# Patient Record
Sex: Female | Born: 1965 | Race: White | Hispanic: No | Marital: Married | Smoking: Former smoker
Health system: Southern US, Community
[De-identification: ages and names within clinical notes are randomized; demographics above are authoritative.]

## PROBLEM LIST (undated history)

## (undated) DIAGNOSIS — N200 Calculus of kidney: Secondary | ICD-10-CM

## (undated) HISTORY — PX: BACK SURGERY: SHX140

## (undated) HISTORY — PX: OTHER SURGICAL HISTORY: SHX169

## (undated) HISTORY — DX: Calculus of kidney: N20.0

---

## 2011-05-13 ENCOUNTER — Emergency Department (HOSPITAL_COMMUNITY)

## 2011-05-13 ENCOUNTER — Emergency Department (HOSPITAL_COMMUNITY)
Admission: EM | Admit: 2011-05-13 | Discharge: 2011-05-13 | Disposition: A | Discharge status: Home / Self Care | Attending: Emergency Medicine | Admitting: Emergency Medicine

## 2011-05-13 DIAGNOSIS — S91109A Unspecified open wound of unspecified toe(s) without damage to nail, initial encounter: Secondary | ICD-10-CM | POA: Insufficient documentation

## 2011-05-13 DIAGNOSIS — IMO0002 Reserved for concepts with insufficient information to code with codable children: Secondary | ICD-10-CM | POA: Insufficient documentation

## 2011-05-13 DIAGNOSIS — R42 Dizziness and giddiness: Secondary | ICD-10-CM | POA: Insufficient documentation

## 2011-05-13 DIAGNOSIS — M79609 Pain in unspecified limb: Secondary | ICD-10-CM | POA: Insufficient documentation

## 2011-05-13 DIAGNOSIS — F411 Generalized anxiety disorder: Secondary | ICD-10-CM | POA: Insufficient documentation

## 2011-05-13 DIAGNOSIS — Y92009 Unspecified place in unspecified non-institutional (private) residence as the place of occurrence of the external cause: Secondary | ICD-10-CM | POA: Insufficient documentation

## 2011-05-13 DIAGNOSIS — S8990XA Unspecified injury of unspecified lower leg, initial encounter: Secondary | ICD-10-CM | POA: Insufficient documentation

## 2011-05-13 DIAGNOSIS — S99919A Unspecified injury of unspecified ankle, initial encounter: Secondary | ICD-10-CM | POA: Insufficient documentation

## 2013-05-27 DIAGNOSIS — F900 Attention-deficit hyperactivity disorder, predominantly inattentive type: Secondary | ICD-10-CM | POA: Insufficient documentation

## 2013-11-11 DIAGNOSIS — F339 Major depressive disorder, recurrent, unspecified: Secondary | ICD-10-CM | POA: Insufficient documentation

## 2014-07-31 DIAGNOSIS — M5412 Radiculopathy, cervical region: Secondary | ICD-10-CM | POA: Insufficient documentation

## 2014-07-31 DIAGNOSIS — M47812 Spondylosis without myelopathy or radiculopathy, cervical region: Secondary | ICD-10-CM | POA: Insufficient documentation

## 2014-07-31 DIAGNOSIS — M542 Cervicalgia: Secondary | ICD-10-CM | POA: Insufficient documentation

## 2017-09-28 DIAGNOSIS — F330 Major depressive disorder, recurrent, mild: Secondary | ICD-10-CM | POA: Insufficient documentation

## 2017-11-01 ENCOUNTER — Other Ambulatory Visit (HOSPITAL_COMMUNITY): Payer: Self-pay | Admitting: Family Medicine

## 2017-11-01 ENCOUNTER — Ambulatory Visit (HOSPITAL_COMMUNITY)
Admission: RE | Admit: 2017-11-01 | Discharge: 2017-11-01 | Disposition: A | Discharge status: Home / Self Care | Source: Ambulatory Visit | Attending: Family Medicine | Admitting: Family Medicine

## 2017-11-01 DIAGNOSIS — M25511 Pain in right shoulder: Secondary | ICD-10-CM | POA: Diagnosis present

## 2017-11-01 DIAGNOSIS — M4802 Spinal stenosis, cervical region: Secondary | ICD-10-CM | POA: Diagnosis not present

## 2017-11-01 DIAGNOSIS — M542 Cervicalgia: Secondary | ICD-10-CM | POA: Diagnosis present

## 2017-11-01 DIAGNOSIS — M5030 Other cervical disc degeneration, unspecified cervical region: Secondary | ICD-10-CM | POA: Diagnosis not present

## 2018-01-09 ENCOUNTER — Emergency Department
Admission: EM | Admit: 2018-01-09 | Discharge: 2018-01-09 | Disposition: A | Discharge status: Home / Self Care | Attending: Emergency Medicine | Admitting: Emergency Medicine

## 2018-01-09 ENCOUNTER — Encounter: Payer: Self-pay | Admitting: Emergency Medicine

## 2018-01-09 ENCOUNTER — Emergency Department

## 2018-01-09 ENCOUNTER — Other Ambulatory Visit: Payer: Self-pay

## 2018-01-09 DIAGNOSIS — Y929 Unspecified place or not applicable: Secondary | ICD-10-CM | POA: Diagnosis not present

## 2018-01-09 DIAGNOSIS — S93401A Sprain of unspecified ligament of right ankle, initial encounter: Secondary | ICD-10-CM

## 2018-01-09 DIAGNOSIS — Z87891 Personal history of nicotine dependence: Secondary | ICD-10-CM | POA: Diagnosis not present

## 2018-01-09 DIAGNOSIS — W19XXXA Unspecified fall, initial encounter: Secondary | ICD-10-CM | POA: Diagnosis not present

## 2018-01-09 DIAGNOSIS — Y9364 Activity, baseball: Secondary | ICD-10-CM | POA: Diagnosis not present

## 2018-01-09 DIAGNOSIS — S99911A Unspecified injury of right ankle, initial encounter: Secondary | ICD-10-CM | POA: Diagnosis present

## 2018-01-09 DIAGNOSIS — Y999 Unspecified external cause status: Secondary | ICD-10-CM | POA: Insufficient documentation

## 2018-01-09 NOTE — ED Triage Notes (Signed)
Pt to triage via w/c with no distress noted; st injured right ankle while playing softball 2 nights ago; ankle is currently wrapped

## 2018-01-09 NOTE — Discharge Instructions (Addendum)
Your x-ray reveals some bone spurs but no evidence of fracture or dislocation. Wear the ankle brace as needed for support. Apply ice to reduce pain and swelling. Take OTC ibuprofen or naproxen for inflammation. Follow-up with podiatry for ongoing symptoms.

## 2018-01-09 NOTE — ED Notes (Signed)
Pt c/o right ankle pain d/t fall 2 days ago while playing softball. Pt has edema and bruising to her right ankle. Pt reports being concerned because it has not healed and is still causing her pain.

## 2018-01-09 NOTE — ED Provider Notes (Signed)
[facility] Emergency Department Provider Note ____________________________________________  Time seen: 2218  I have reviewed the triage vital signs and the nursing notes.  HISTORY  Chief Complaint  Ankle Pain  HPI Anne Haynes is a 52 y.o. female presents to the ED for evaluation of pain to the right ankle.  Patient describes injury 2 days prior to playing softball.  She describes a fall and noted edema and bruising to the right ankle and the lowe leg. She has been walking a lot today, without any brace or support.  She reports pain and disability to the ankle but denies any other injury at this time.  History reviewed. No pertinent past medical history.  There are no active problems to display for this patient.   Past Surgical History:  Procedure Laterality Date  . BACK SURGERY      Prior to Admission medications   Not on File    Allergies Patient has no known allergies.  No family history on file.  Social History Social History   Tobacco Use  . Smoking status: Former Games developer  . Smokeless tobacco: Former Engineer, water Use Topics  . Alcohol use: Not on file  . Drug use: Not on file    Review of Systems  Constitutional: Negative for fever. Cardiovascular: Negative for chest pain. Respiratory: Negative for shortness of breath. Musculoskeletal: Negative for back pain.  Right ankle pain as above. Skin: Negative for rash. Neurological: Negative for headaches, focal weakness or numbness. ____________________________________________  PHYSICAL EXAM:  VITAL SIGNS: ED Triage Vitals  Enc Vitals Group     BP 01/09/18 2120 (!) 153/91     Pulse Rate 01/09/18 2120 87     Resp 01/09/18 2120 18     Temp 01/09/18 2120 97.9 F (36.6 C)     Temp Source 01/09/18 2120 Oral     SpO2 01/09/18 2120 99 %     Weight 01/09/18 2121 170 lb (77.1 kg)     Height 01/09/18 2121 5\' 8"  (1.727 m)     Head Circumference --      Peak Flow --      Pain Score  01/09/18 2121 4     Pain Loc --      Pain Edu? --      Excl. in [place]? --     Constitutional: Alert and oriented. Well appearing and in no distress. Head: Normocephalic and atraumatic. Cardiovascular: Normal rate, regular rhythm. Normal distal pulses. Respiratory: Normal respiratory effort.  Musculoskeletal: Ankle with obvious soft tissue swelling laterally as well as some lateral foot ecchymosis appreciated.  Patient with normal ankle range of motion on exam.  No calf or Achilles tenderness is appreciated. There is also no anterior/posterior drawer sign.  Nontender with normal range of motion in all extremities.  Neurologic:  Normal gait without ataxia. Normal speech and language. No gross focal neurologic deficits are appreciated. Skin:  Skin is warm, dry and intact. No rash noted. ____________________________________________   RADIOLOGY  Right Ankle IMPRESSION: Lateral soft tissue swelling. No acute fracture or dislocation is identified. 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  I, Ellanore Vanhook, Charlesetta Ivory, personally viewed and evaluated these images (plain radiographs) as part of my medical decision making, as well as reviewing the written report by the radiologist. ____________________________________________  PROCEDURES  Procedures Velcro ankle stirrup splint ____________________________________________  INITIAL IMPRESSION / ASSESSMENT AND PLAN / ED COURSE  Patient with ED evaluation of ankle pain and swelling following mechanical fall.  Patient's exam is consistent with a grade 2 right ankle sprain.  Her x-rays negative and reassuring at this time for any acute fracture or dislocation she does have some mild to moderate bone spurring and ossicles noted.  She is discharged with instructions to dose over-the-counter ibuprofen.  She is referred to podiatry for ongoing management as necessary. ____________________________________________  FINAL CLINICAL  IMPRESSION(S) / ED DIAGNOSES  Final diagnoses:  Sprain of right ankle, unspecified ligament, initial encounter      Lissa HoardMenshew, Mirka Barbone V Bacon, PA-C 01/09/18 2252    Don PerkingVeronese, [place], MD 01/10/18 [id number]

## 2019-06-30 ENCOUNTER — Emergency Department

## 2019-06-30 ENCOUNTER — Emergency Department
Admission: EM | Admit: 2019-06-30 | Discharge: 2019-06-30 | Disposition: A | Discharge status: Home / Self Care | Attending: Emergency Medicine | Admitting: Emergency Medicine

## 2019-06-30 ENCOUNTER — Other Ambulatory Visit: Payer: Self-pay

## 2019-06-30 DIAGNOSIS — Z87891 Personal history of nicotine dependence: Secondary | ICD-10-CM | POA: Diagnosis not present

## 2019-06-30 DIAGNOSIS — N130 Hydronephrosis with ureteropelvic junction obstruction: Secondary | ICD-10-CM | POA: Insufficient documentation

## 2019-06-30 DIAGNOSIS — R1032 Left lower quadrant pain: Secondary | ICD-10-CM | POA: Diagnosis present

## 2019-06-30 DIAGNOSIS — N200 Calculus of kidney: Secondary | ICD-10-CM

## 2019-06-30 LAB — URINALYSIS, COMPLETE (UACMP) WITH MICROSCOPIC
Bacteria, UA: NONE SEEN
Bilirubin Urine: NEGATIVE
Glucose, UA: NEGATIVE mg/dL
Ketones, ur: NEGATIVE mg/dL
Nitrite: NEGATIVE
Protein, ur: NEGATIVE mg/dL
Specific Gravity, Urine: >1.046 — ABNORMAL HIGH (ref 1.005–1.030)
pH: 7 (ref 5.0–8.0)

## 2019-06-30 LAB — COMPREHENSIVE METABOLIC PANEL WITH GFR
ALT: 19 U/L (ref 0–44)
AST: 18 U/L (ref 15–41)
Albumin: 4.5 g/dL (ref 3.5–5.0)
Alkaline Phosphatase: 78 U/L (ref 38–126)
Anion gap: 4 — ABNORMAL LOW (ref 5–15)
BUN: 16 mg/dL (ref 6–20)
CO2: 28 mmol/L (ref 22–32)
Calcium: 11.8 mg/dL — ABNORMAL HIGH (ref 8.9–10.3)
Chloride: 106 mmol/L (ref 98–111)
Creatinine, Ser: 0.73 mg/dL (ref 0.44–1.00)
GFR calc Af Amer: >60 mL/min
GFR calc non Af Amer: >60 mL/min
Glucose, Bld: 107 mg/dL — ABNORMAL HIGH (ref 70–99)
Potassium: 3.7 mmol/L (ref 3.5–5.1)
Sodium: 138 mmol/L (ref 135–145)
Total Bilirubin: 0.8 mg/dL (ref 0.3–1.2)
Total Protein: 7.1 g/dL (ref 6.5–8.1)

## 2019-06-30 LAB — CBC
HCT: 46.1 % — ABNORMAL HIGH (ref 36.0–46.0)
Hemoglobin: 15.5 g/dL — ABNORMAL HIGH (ref 12.0–15.0)
MCH: 31.1 pg (ref 26.0–34.0)
MCHC: 33.6 g/dL (ref 30.0–36.0)
MCV: 92.6 fL (ref 80.0–100.0)
Platelets: 337 K/uL (ref 150–400)
RBC: 4.98 MIL/uL (ref 3.87–5.11)
RDW: 11.6 % (ref 11.5–15.5)
WBC: 10.6 K/uL — ABNORMAL HIGH (ref 4.0–10.5)
nRBC: 0 % (ref 0.0–0.2)

## 2019-06-30 LAB — LIPASE, BLOOD: Lipase: 22 U/L (ref 11–51)

## 2019-06-30 MED ORDER — IBUPROFEN 600 MG PO TABS: 600.0000 mg | ORAL_TABLET | Freq: Four times a day (QID) | ORAL | 0 refills | Status: DC | PRN

## 2019-06-30 MED ORDER — OXYCODONE-ACETAMINOPHEN 5-325 MG PO TABS: 1.0000 | ORAL_TABLET | Freq: Four times a day (QID) | ORAL | 0 refills | Status: DC | PRN

## 2019-06-30 MED ORDER — SODIUM CHLORIDE 0.9 % IV BOLUS
1000.0000 mL | Freq: Once | INTRAVENOUS | Status: AC
  Administered 2019-06-30: 1000 mL via INTRAVENOUS

## 2019-06-30 MED ORDER — ONDANSETRON HCL 4 MG/2ML IJ SOLN
4.0000 mg | Freq: Once | INTRAMUSCULAR | Status: AC
Start: 2019-06-30 — End: 2019-06-30
  Administered 2019-06-30: 4 mg via INTRAVENOUS
  Filled 2019-06-30: qty 2

## 2019-06-30 MED ORDER — HYDROCODONE-ACETAMINOPHEN 5-325 MG PO TABS: 2.0000 | ORAL_TABLET | Freq: Once | ORAL | Status: DC

## 2019-06-30 MED ORDER — ONDANSETRON 4 MG PO TBDP: 4.0000 mg | ORAL_TABLET | Freq: Three times a day (TID) | ORAL | 0 refills | Status: DC | PRN

## 2019-06-30 MED ORDER — HYDROMORPHONE HCL 1 MG/ML IJ SOLN
1.0000 mg | Freq: Once | INTRAMUSCULAR | Status: AC
  Administered 2019-06-30: 10:00:00 1 mg via INTRAVENOUS
  Filled 2019-06-30: qty 1

## 2019-06-30 MED ORDER — IOHEXOL 300 MG/ML  SOLN
100.0000 mL | Freq: Once | INTRAMUSCULAR | Status: AC | PRN
  Administered 2019-06-30: 100 mL via INTRAVENOUS

## 2019-06-30 MED ORDER — ONDANSETRON HCL 4 MG/2ML IJ SOLN
4.0000 mg | Freq: Once | INTRAMUSCULAR | Status: AC | PRN
  Administered 2019-06-30: 4 mg via INTRAVENOUS
  Filled 2019-06-30: qty 2

## 2019-06-30 MED ORDER — SODIUM CHLORIDE 0.9 % IV BOLUS
1000.0000 mL | Freq: Once | INTRAVENOUS | Status: AC
  Administered 2019-06-30: 10:00:00 1000 mL via INTRAVENOUS

## 2019-06-30 MED ORDER — KETOROLAC TROMETHAMINE 30 MG/ML IJ SOLN
15.0000 mg | Freq: Once | INTRAMUSCULAR | Status: AC
  Administered 2019-06-30: 11:00:00 15 mg via INTRAVENOUS
  Filled 2019-06-30: qty 1

## 2019-06-30 NOTE — ED Notes (Signed)
ED Provider Issacs at bedside. 

## 2019-06-30 NOTE — Discharge Instructions (Addendum)
As we discussed, drink plenty of fluid.  Avoid Tums or anything high in calcium.  I recommend having a repeat level checked in the next week, as high calcium could be causing your stones and could also be indicative of underlying more serious conditions.  Take the ibuprofen and stronger pain medications as needed.  I would expect your pain to improve over the next several days.  If it does not, call urology for a close follow-up appointment.

## 2019-06-30 NOTE — ED Notes (Signed)
Pt able to tolerate drink and saltines. Pt denies N/V at this time.

## 2019-06-30 NOTE — ED Notes (Signed)
Patient denies pain and is resting comfortably.  

## 2019-06-30 NOTE — ED Notes (Signed)
Pt ambulatory to toilet with a steady gait before DC.

## 2019-06-30 NOTE — ED Provider Notes (Signed)
[facility] Emergency Department Provider Note  ____________________________________________   First MD Initiated Contact with Patient 06/30/19 0901     (approximate)  I have reviewed the triage vital signs and the nursing notes.   HISTORY  Chief Complaint Abdominal Pain    HPI Anne Haynes is a 53 y.o. female here with left flank pain.  The patient states that she awoke this morning with severe, aching, gnawing, left flank pain.  This radiates down towards her left lower quadrant.  She had associated nausea and vomiting.  The pain seemed to come on and nowhere and was immediately severe.  It has since come and gone, but is persistent.  She denies any fevers.  She is not had any urinary symptoms, including no dysuria or hematuria.  No vaginal bleeding.  Denies any history of diverticulitis or colitis.  No other complaints.  She does report some intermittent abdominal cramping for several months, but this is not acutely worsened.        History reviewed. No pertinent past medical history.  There are no active problems to display for this patient.   Past Surgical History:  Procedure Laterality Date  . BACK SURGERY      Prior to Admission medications   Medication Sig Start Date End Date Taking? Authorizing Provider  ibuprofen (ADVIL) 600 MG tablet Take 1 tablet (600 mg total) by mouth every 6 (six) hours as needed for moderate pain. 06/30/19   Shaune PollackIsaacs, Lucely Leard, MD  ondansetron (ZOFRAN ODT) 4 MG disintegrating tablet Take 1 tablet (4 mg total) by mouth every 8 (eight) hours as needed for nausea or vomiting. 06/30/19   Shaune PollackIsaacs, Jarren Para, MD  oxyCODONE-acetaminophen (PERCOCET) 5-325 MG tablet Take 1-2 tablets by mouth every 6 (six) hours as needed for moderate pain or severe pain. 06/30/19 06/29/20  Shaune PollackIsaacs, Kahlen Morais, MD    Allergies Patient has no known allergies.  No family history on file.  Social History Social History   Tobacco Use  . Smoking  status: Former Games developermoker  . Smokeless tobacco: Former Engineer, waterUser  Substance Use Topics  . Alcohol use: Yes  . Drug use: Not Currently    Review of Systems  Review of Systems  Constitutional: Positive for fatigue. Negative for fever.  HENT: Negative for congestion and sore throat.   Eyes: Negative for visual disturbance.  Respiratory: Negative for cough and shortness of breath.   Cardiovascular: Negative for chest pain.  Gastrointestinal: Positive for abdominal pain and nausea. Negative for diarrhea and vomiting.  Genitourinary: Positive for flank pain.  Musculoskeletal: Negative for back pain and neck pain.  Skin: Negative for rash and wound.  Neurological: Negative for weakness.  All other systems reviewed and are negative.    ____________________________________________  PHYSICAL EXAM:      VITAL SIGNS: ED Triage Vitals  Enc Vitals Group     BP 06/30/19 0817 (!) 163/108     Pulse Rate 06/30/19 0817 (!) 101     Resp 06/30/19 0817 20     Temp 06/30/19 0817 97.8 F (36.6 C)     Temp Source 06/30/19 0817 Axillary     SpO2 06/30/19 0817 100 %     Weight --      Height --      Head Circumference --      Peak Flow --      Pain Score 06/30/19 0821 7     Pain Loc --      Pain Edu? --  Excl. in [place]? --      Physical Exam Vitals signs and nursing note reviewed.  Constitutional:      General: She is not in acute distress.    Appearance: She is well-developed.     Comments: Appears uncomfortable  HENT:     Head: Normocephalic and atraumatic.  Eyes:     Conjunctiva/sclera: Conjunctivae normal.  Neck:     Musculoskeletal: Neck supple.  Cardiovascular:     Rate and Rhythm: Normal rate and regular rhythm.     Heart sounds: Normal heart sounds.  Pulmonary:     Effort: Pulmonary effort is normal. No respiratory distress.     Breath sounds: No wheezing.  Abdominal:     General: Bowel sounds are normal. There is no distension.     Tenderness: There is abdominal tenderness in  the periumbilical area, left upper quadrant and left lower quadrant. There is no guarding or rebound.  Skin:    General: Skin is warm.     Capillary Refill: Capillary refill takes less than 2 seconds.     Findings: No rash.  Neurological:     Mental Status: She is alert and oriented to person, place, and time.     Motor: No abnormal muscle tone.       ____________________________________________   LABS (all labs ordered are listed, but only abnormal results are displayed)  Labs Reviewed  COMPREHENSIVE METABOLIC PANEL - Abnormal; Notable for the following components:      Result Value   Glucose, Bld 107 (*)    Calcium 11.8 (*)    Anion gap 4 (*)    All other components within normal limits  CBC - Abnormal; Notable for the following components:   WBC 10.6 (*)    Hemoglobin 15.5 (*)    HCT 46.1 (*)    All other components within normal limits  URINALYSIS, COMPLETE (UACMP) WITH MICROSCOPIC - Abnormal; Notable for the following components:   Color, Urine YELLOW (*)    APPearance HAZY (*)    Specific Gravity, Urine >1.046 (*)    Hgb urine dipstick MODERATE (*)    Leukocytes,Ua TRACE (*)    All other components within normal limits  LIPASE, BLOOD    ____________________________________________  EKG: None ________________________________________  RADIOLOGY All imaging, including plain films, CT scans, and ultrasounds, independently reviewed by me, and interpretations confirmed via formal radiology reads.  ED MD interpretation:   CT scan: Moderate left hydrodue to 8 mm calculus at the UPJ, mild left ureteral dilation due to 4 mm at the left UVJ  Official radiology report(s): Dg Elbow Complete Left  Result Date: 06/30/2019 CLINICAL DATA:  Left elbow pain EXAM: LEFT ELBOW - COMPLETE 3+ VIEW COMPARISON:  None. FINDINGS: There is no evidence of fracture, dislocation, or joint effusion. There is no evidence of arthropathy or other focal bone abnormality. Soft tissues are  unremarkable. IMPRESSION: Negative. Electronically Signed   By: Rolm Baptise M.D.   On: 06/30/2019 10:26   Ct Abdomen Pelvis W Contrast  Result Date: 06/30/2019 CLINICAL DATA:  Acute left lower quadrant abdominal pain. EXAM: CT ABDOMEN AND PELVIS WITH CONTRAST TECHNIQUE: Multidetector CT imaging of the abdomen and pelvis was performed using the standard protocol following bolus administration of intravenous contrast. CONTRAST:  137mL OMNIPAQUE IOHEXOL 300 MG/ML  SOLN COMPARISON:  None. FINDINGS: Lower chest: No acute abnormality. Hepatobiliary: No focal liver abnormality is seen. No gallstones, gallbladder wall thickening, or biliary dilatation. Pancreas: Unremarkable. No pancreatic ductal dilatation or surrounding  inflammatory changes. Spleen: Normal in size without focal abnormality. Kidneys: Adrenal glands are unremarkable. Right kidney and ureter are unremarkable. Moderate left hydronephrosis is noted secondary to 8 mm calculus at the left ureteropelvic junction. Mild left ureteral dilatation is noted secondary to 4 mm calculus at the left ureterovesical junction. Urinary bladder is otherwise unremarkable. Stomach/Bowel: Stomach is within normal limits. Appendix appears normal. No evidence of bowel wall thickening, distention, or inflammatory changes. Vascular/Lymphatic: No significant vascular findings are present. No enlarged abdominal or pelvic lymph nodes. Reproductive: Uterus and bilateral adnexa are unremarkable. Other: No abdominal wall hernia or abnormality. No abdominopelvic ascites. Musculoskeletal: No acute or significant osseous findings. IMPRESSION: Moderate left hydronephrosis is noted secondary to 8 mm calculus at the left ureteropelvic junction. Mild left ureteral dilatation is noted secondary to 4 mm calculus at the left ureterovesical junction. Electronically Signed   By: Lupita Raider M.D.   On: 06/30/2019 10:13     ____________________________________________  PROCEDURES   Procedure(s) performed (including Critical Care):  Procedures  ____________________________________________  INITIAL IMPRESSION / MDM / ASSESSMENT AND PLAN / ED COURSE  As part of my medical decision making, I reviewed the following data within the electronic MEDICAL RECORD NUMBER       *Anne Haynes was evaluated in Emergency Department on 06/30/2019 for the symptoms described in the history of present illness. She was evaluated in the context of the global COVID-19 pandemic, which necessitated consideration that the patient might be at risk for infection with the SARS-CoV-2 virus that causes COVID-19. Institutional protocols and algorithms that pertain to the evaluation of patients at risk for COVID-19 are in a state of rapid change based on information released by regulatory bodies including the CDC and federal and state organizations. These policies and algorithms were followed during the patient's care in the ED.  Some ED evaluations and interventions may be delayed as a result of limited staffing during the pandemic.*      Medical Decision Making: 53 year old female here with severe left flank pain.  Imaging is consistent with 4 mm stone at the left UVJ which I suspect is causing her symptoms, though she also incidentally has an 8 mm stone at the UPJ.  Following fluids and analgesia, patient has had complete resolution of her pain.  She has no significant fever, tachycardia, or pyuria to suggest infected stone.  Of note, she does have moderate hypercalcemia.  This is unclear etiology.  No history of thyroid or parathyroid issues.  She denies any excessive calcium in her diet.  No known history of malignancy.  I reiterated the importance of close PCP follow-up for work-up of her hypercalcemia, and she was given 2 L of fluid here.  ____________________________________________  FINAL CLINICAL IMPRESSION(S) / ED  DIAGNOSES  Final diagnoses:  Nephrolithiasis  Hypercalcemia     MEDICATIONS GIVEN DURING THIS VISIT:  Medications  HYDROcodone-acetaminophen (NORCO/VICODIN) 5-325 MG per tablet 2 tablet (0 tablets Oral Hold 06/30/19 1208)  ondansetron (ZOFRAN) injection 4 mg (4 mg Intravenous Given 06/30/19 0846)  sodium chloride 0.9 % bolus 1,000 mL (1,000 mLs Intravenous New Bag/Given 06/30/19 0945)  sodium chloride 0.9 % bolus 1,000 mL (1,000 mLs Intravenous New Bag/Given 06/30/19 0942)  HYDROmorphone (DILAUDID) injection 1 mg (1 mg Intravenous Given 06/30/19 0943)  ondansetron (ZOFRAN) injection 4 mg (4 mg Intravenous Given 06/30/19 0943)  iohexol (OMNIPAQUE) 300 MG/ML solution 100 mL (100 mLs Intravenous Contrast Given 06/30/19 0954)  ketorolac (TORADOL) 30 MG/ML injection 15 mg (15 mg Intravenous Given  06/30/19 1048)     ED Discharge Orders         Ordered    oxyCODONE-acetaminophen (PERCOCET) 5-325 MG tablet  Every 6 hours PRN     06/30/19 1228    ondansetron (ZOFRAN ODT) 4 MG disintegrating tablet  Every 8 hours PRN     06/30/19 1228    ibuprofen (ADVIL) 600 MG tablet  Every 6 hours PRN     06/30/19 1228           Note:  This document was prepared using Dragon voice recognition software and may include unintentional dictation errors.   Shaune Pollack, MD 06/30/19 1228

## 2019-06-30 NOTE — ED Notes (Signed)
Pt ambulatory to bathroom with a steady gait

## 2019-06-30 NOTE — ED Notes (Signed)
Pt provide with ginger ale and saltines per MD request.

## 2019-06-30 NOTE — ED Triage Notes (Signed)
Pt c/o LLQ pain with N/V that started about 3 hr PTA. Denies diarrhea

## 2019-07-01 ENCOUNTER — Other Ambulatory Visit: Payer: Self-pay

## 2019-07-01 ENCOUNTER — Observation Stay: Admitting: Anesthesiology

## 2019-07-01 ENCOUNTER — Observation Stay
Admission: EM | Admit: 2019-07-01 | Discharge: 2019-07-02 | Disposition: A | Discharge status: Home / Self Care | Attending: Internal Medicine | Admitting: Internal Medicine

## 2019-07-01 ENCOUNTER — Encounter
Admission: EM | Disposition: A | Discharge status: Home / Self Care | Payer: Self-pay | Source: Home / Self Care | Attending: Emergency Medicine

## 2019-07-01 ENCOUNTER — Encounter: Payer: Self-pay | Admitting: Emergency Medicine

## 2019-07-01 DIAGNOSIS — Z87891 Personal history of nicotine dependence: Secondary | ICD-10-CM | POA: Insufficient documentation

## 2019-07-01 DIAGNOSIS — Z23 Encounter for immunization: Secondary | ICD-10-CM | POA: Insufficient documentation

## 2019-07-01 DIAGNOSIS — I10 Essential (primary) hypertension: Secondary | ICD-10-CM | POA: Diagnosis not present

## 2019-07-01 DIAGNOSIS — M47812 Spondylosis without myelopathy or radiculopathy, cervical region: Secondary | ICD-10-CM | POA: Insufficient documentation

## 2019-07-01 DIAGNOSIS — Z7901 Long term (current) use of anticoagulants: Secondary | ICD-10-CM | POA: Insufficient documentation

## 2019-07-01 DIAGNOSIS — Z791 Long term (current) use of non-steroidal anti-inflammatories (NSAID): Secondary | ICD-10-CM | POA: Insufficient documentation

## 2019-07-01 DIAGNOSIS — Z20828 Contact with and (suspected) exposure to other viral communicable diseases: Secondary | ICD-10-CM | POA: Insufficient documentation

## 2019-07-01 DIAGNOSIS — F909 Attention-deficit hyperactivity disorder, unspecified type: Secondary | ICD-10-CM | POA: Diagnosis not present

## 2019-07-01 DIAGNOSIS — F329 Major depressive disorder, single episode, unspecified: Secondary | ICD-10-CM | POA: Insufficient documentation

## 2019-07-01 DIAGNOSIS — R32 Unspecified urinary incontinence: Secondary | ICD-10-CM | POA: Diagnosis not present

## 2019-07-01 DIAGNOSIS — N132 Hydronephrosis with renal and ureteral calculous obstruction: Secondary | ICD-10-CM | POA: Diagnosis not present

## 2019-07-01 DIAGNOSIS — D72829 Elevated white blood cell count, unspecified: Secondary | ICD-10-CM | POA: Diagnosis not present

## 2019-07-01 DIAGNOSIS — Z79899 Other long term (current) drug therapy: Secondary | ICD-10-CM | POA: Insufficient documentation

## 2019-07-01 DIAGNOSIS — N201 Calculus of ureter: Secondary | ICD-10-CM | POA: Diagnosis present

## 2019-07-01 DIAGNOSIS — N1330 Unspecified hydronephrosis: Secondary | ICD-10-CM | POA: Diagnosis present

## 2019-07-01 HISTORY — PX: CYSTOSCOPY WITH STENT PLACEMENT: SHX5790

## 2019-07-01 HISTORY — PX: URETEROSCOPY WITH HOLMIUM LASER LITHOTRIPSY: SHX6645

## 2019-07-01 HISTORY — PX: STONE EXTRACTION WITH BASKET: SHX5318

## 2019-07-01 LAB — CBC WITH DIFFERENTIAL/PLATELET
Abs Immature Granulocytes: 0.06 10*3/uL (ref 0.00–0.07)
Basophils Absolute: 0 10*3/uL (ref 0.0–0.1)
Basophils Relative: 0 %
Eosinophils Absolute: 0 10*3/uL (ref 0.0–0.5)
Eosinophils Relative: 0 %
HCT: 44.8 % (ref 36.0–46.0)
Hemoglobin: 15.1 g/dL — ABNORMAL HIGH (ref 12.0–15.0)
Immature Granulocytes: 0 %
Lymphocytes Relative: 10 %
Lymphs Abs: 1.7 10*3/uL (ref 0.7–4.0)
MCH: 31.4 pg (ref 26.0–34.0)
MCHC: 33.7 g/dL (ref 30.0–36.0)
MCV: 93.1 fL (ref 80.0–100.0)
Monocytes Absolute: 0.9 10*3/uL (ref 0.1–1.0)
Monocytes Relative: 5 %
Neutro Abs: 13.7 10*3/uL — ABNORMAL HIGH (ref 1.7–7.7)
Neutrophils Relative %: 85 %
Platelets: 322 10*3/uL (ref 150–400)
RBC: 4.81 MIL/uL (ref 3.87–5.11)
RDW: 11.7 % (ref 11.5–15.5)
WBC: 16.4 10*3/uL — ABNORMAL HIGH (ref 4.0–10.5)
nRBC: 0 % (ref 0.0–0.2)

## 2019-07-01 LAB — BASIC METABOLIC PANEL
Anion gap: 9 (ref 5–15)
BUN: 13 mg/dL (ref 6–20)
CO2: 23 mmol/L (ref 22–32)
Calcium: 11.8 mg/dL — ABNORMAL HIGH (ref 8.9–10.3)
Chloride: 109 mmol/L (ref 98–111)
Creatinine, Ser: 0.87 mg/dL (ref 0.44–1.00)
GFR calc Af Amer: >60 mL/min (ref >60)
GFR calc non Af Amer: >60 mL/min (ref >60)
Glucose, Bld: 121 mg/dL — ABNORMAL HIGH (ref 70–99)
Potassium: 4.1 mmol/L (ref 3.5–5.1)
Sodium: 141 mmol/L (ref 135–145)

## 2019-07-01 LAB — SARS CORONAVIRUS 2 BY RT PCR (HOSPITAL ORDER, PERFORMED IN ~~LOC~~ HOSPITAL LAB): SARS Coronavirus 2: NEGATIVE

## 2019-07-01 SURGERY — URETEROSCOPY, WITH LITHOTRIPSY USING HOLMIUM LASER
Anesthesia: General | Site: Ureter | Laterality: Left

## 2019-07-01 MED ORDER — SODIUM CHLORIDE FLUSH 0.9 % IV SOLN
INTRAVENOUS | Status: AC
  Administered 2019-07-01: 15:00:00
  Filled 2019-07-01: qty 10

## 2019-07-01 MED ORDER — HYDROMORPHONE HCL 1 MG/ML IJ SOLN
1.0000 mg | Freq: Once | INTRAMUSCULAR | Status: AC
  Administered 2019-07-01: 1 mg via INTRAVENOUS
  Filled 2019-07-01: qty 1

## 2019-07-01 MED ORDER — DEXTROAMPHETAMINE SULFATE ER 5 MG PO CP24: 30.0000 mg | ORAL_CAPSULE | Freq: Two times a day (BID) | ORAL | Status: DC

## 2019-07-01 MED ORDER — ACETAMINOPHEN 325 MG PO TABS
650.0000 mg | ORAL_TABLET | Freq: Four times a day (QID) | ORAL | Status: DC | PRN
  Administered 2019-07-01: 650 mg via ORAL
  Filled 2019-07-01: qty 2

## 2019-07-01 MED ORDER — ROCURONIUM BROMIDE 100 MG/10ML IV SOLN
INTRAVENOUS | Status: DC | PRN
  Administered 2019-07-01: 40 mg via INTRAVENOUS

## 2019-07-01 MED ORDER — KETOROLAC TROMETHAMINE 30 MG/ML IJ SOLN
15.0000 mg | Freq: Once | INTRAMUSCULAR | Status: AC
  Administered 2019-07-01: 15 mg via INTRAVENOUS
  Filled 2019-07-01: qty 1

## 2019-07-01 MED ORDER — DEXTROAMPHETAMINE SULFATE ER 5 MG PO CP24
30.0000 mg | ORAL_CAPSULE | Freq: Two times a day (BID) | ORAL | Status: DC
  Administered 2019-07-02 (×2): 30 mg via ORAL
  Filled 2019-07-01 (×2): qty 6

## 2019-07-01 MED ORDER — MAGNESIUM SULFATE 2 GM/50ML IV SOLN
2.0000 g | Freq: Once | INTRAVENOUS | Status: AC
  Administered 2019-07-02: 2 g via INTRAVENOUS
  Filled 2019-07-01: qty 50

## 2019-07-01 MED ORDER — HYDROMORPHONE HCL 1 MG/ML IJ SOLN
1.0000 mg | Freq: Once | INTRAMUSCULAR | Status: AC
  Administered 2019-07-01: 08:00:00 1 mg via INTRAVENOUS
  Filled 2019-07-01: qty 1

## 2019-07-01 MED ORDER — ONDANSETRON HCL 4 MG PO TABS: 4.0000 mg | ORAL_TABLET | Freq: Four times a day (QID) | ORAL | Status: DC | PRN

## 2019-07-01 MED ORDER — BISACODYL 5 MG PO TBEC: 5.0000 mg | DELAYED_RELEASE_TABLET | Freq: Every day | ORAL | Status: DC | PRN

## 2019-07-01 MED ORDER — ONDANSETRON HCL 4 MG/2ML IJ SOLN
4.0000 mg | Freq: Once | INTRAMUSCULAR | Status: AC
  Administered 2019-07-01: 4 mg via INTRAVENOUS
  Filled 2019-07-01: qty 2

## 2019-07-01 MED ORDER — OXYBUTYNIN CHLORIDE 5 MG PO TABS
ORAL_TABLET | ORAL | Status: AC
  Administered 2019-07-01: 17:00:00
  Filled 2019-07-01: qty 1

## 2019-07-01 MED ORDER — SUMATRIPTAN SUCCINATE 50 MG PO TABS
50.0000 mg | ORAL_TABLET | Freq: Once | ORAL | Status: AC
  Administered 2019-07-01: 50 mg via ORAL
  Filled 2019-07-01: qty 1

## 2019-07-01 MED ORDER — MIDAZOLAM HCL 2 MG/2ML IJ SOLN
INTRAMUSCULAR | Status: DC | PRN
  Administered 2019-07-01: 2 mg via INTRAVENOUS

## 2019-07-01 MED ORDER — LIDOCAINE HCL (CARDIAC) PF 100 MG/5ML IV SOSY
PREFILLED_SYRINGE | INTRAVENOUS | Status: DC | PRN
  Administered 2019-07-01: 60 mg via INTRAVENOUS

## 2019-07-01 MED ORDER — OXYCODONE HCL 5 MG/5ML PO SOLN: 5.0000 mg | Freq: Once | ORAL | Status: DC | PRN

## 2019-07-01 MED ORDER — SODIUM CHLORIDE 0.9 % IV BOLUS
1000.0000 mL | Freq: Once | INTRAVENOUS | Status: AC
Start: 2019-07-01 — End: 2019-07-01
  Administered 2019-07-01: 1000 mL via INTRAVENOUS

## 2019-07-01 MED ORDER — PHENYLEPHRINE HCL (PRESSORS) 10 MG/ML IV SOLN
INTRAVENOUS | Status: DC | PRN
  Administered 2019-07-01 (×2): 100 ug via INTRAVENOUS

## 2019-07-01 MED ORDER — VALPROATE SODIUM 500 MG/5ML IV SOLN
500.0000 mg | Freq: Once | INTRAVENOUS | Status: AC
  Administered 2019-07-01: 500 mg via INTRAVENOUS
  Filled 2019-07-01: qty 5

## 2019-07-01 MED ORDER — FENTANYL CITRATE (PF) 100 MCG/2ML IJ SOLN
INTRAMUSCULAR | Status: DC | PRN
  Administered 2019-07-01: 50 ug via INTRAVENOUS
  Administered 2019-07-01: 25 ug via INTRAVENOUS

## 2019-07-01 MED ORDER — HYDROMORPHONE HCL 1 MG/ML IJ SOLN: 1.0000 mg | INTRAMUSCULAR | Status: DC | PRN

## 2019-07-01 MED ORDER — SODIUM CHLORIDE 0.9 % IV SOLN
1.0000 g | Freq: Once | INTRAVENOUS | Status: AC
  Administered 2019-07-01: 1 g via INTRAVENOUS
  Filled 2019-07-01: qty 10

## 2019-07-01 MED ORDER — DEXAMETHASONE SODIUM PHOSPHATE 10 MG/ML IJ SOLN
INTRAMUSCULAR | Status: DC | PRN
  Administered 2019-07-01: 4 mg via INTRAVENOUS

## 2019-07-01 MED ORDER — INFLUENZA VAC SPLIT QUAD 0.5 ML IM SUSY
0.5000 mL | PREFILLED_SYRINGE | INTRAMUSCULAR | Status: AC
  Administered 2019-07-02: 0.5 mL via INTRAMUSCULAR
  Filled 2019-07-01: qty 0.5

## 2019-07-01 MED ORDER — HYDROCODONE-ACETAMINOPHEN 5-325 MG PO TABS: 1.0000 | ORAL_TABLET | ORAL | Status: DC | PRN

## 2019-07-01 MED ORDER — IOHEXOL 180 MG/ML  SOLN
INTRAMUSCULAR | Status: DC | PRN
  Administered 2019-07-01: 16:00:00 20 mL

## 2019-07-01 MED ORDER — ONDANSETRON HCL 4 MG/2ML IJ SOLN: 4.0000 mg | Freq: Four times a day (QID) | INTRAMUSCULAR | Status: DC | PRN

## 2019-07-01 MED ORDER — ALBUTEROL SULFATE (2.5 MG/3ML) 0.083% IN NEBU: 2.5000 mg | INHALATION_SOLUTION | RESPIRATORY_TRACT | Status: DC | PRN

## 2019-07-01 MED ORDER — SENNOSIDES-DOCUSATE SODIUM 8.6-50 MG PO TABS: 1.0000 | ORAL_TABLET | Freq: Every evening | ORAL | Status: DC | PRN

## 2019-07-01 MED ORDER — SODIUM CHLORIDE 0.9 % IV SOLN
INTRAVENOUS | Status: DC | PRN
  Administered 2019-07-01: 15:00:00 via INTRAVENOUS

## 2019-07-01 MED ORDER — OXYMETAZOLINE HCL 0.05 % NA SOLN
1.0000 | Freq: Once | NASAL | Status: AC
  Administered 2019-07-01: 1 via NASAL
  Filled 2019-07-01: qty 30

## 2019-07-01 MED ORDER — PROPOFOL 10 MG/ML IV BOLUS
INTRAVENOUS | Status: DC | PRN
  Administered 2019-07-01: 100 mg via INTRAVENOUS
  Administered 2019-07-01: 20 mg via INTRAVENOUS

## 2019-07-01 MED ORDER — ALPRAZOLAM 0.5 MG PO TABS: 0.5000 mg | ORAL_TABLET | Freq: Three times a day (TID) | ORAL | Status: DC | PRN

## 2019-07-01 MED ORDER — SUGAMMADEX SODIUM 200 MG/2ML IV SOLN
INTRAVENOUS | Status: DC | PRN
  Administered 2019-07-01: 200 mg via INTRAVENOUS

## 2019-07-01 MED ORDER — SODIUM CHLORIDE 0.9 % IV SOLN
INTRAVENOUS | Status: DC
  Administered 2019-07-01 – 2019-07-02 (×2): via INTRAVENOUS

## 2019-07-01 MED ORDER — FENTANYL CITRATE (PF) 100 MCG/2ML IJ SOLN: 25.0000 ug | INTRAMUSCULAR | Status: DC | PRN

## 2019-07-01 MED ORDER — OXYBUTYNIN CHLORIDE 5 MG PO TABS
5.0000 mg | ORAL_TABLET | Freq: Once | ORAL | Status: AC
  Administered 2019-07-01: 5 mg via ORAL

## 2019-07-01 MED ORDER — TAMSULOSIN HCL 0.4 MG PO CAPS
0.4000 mg | ORAL_CAPSULE | Freq: Every day | ORAL | Status: DC
  Administered 2019-07-01 – 2019-07-02 (×2): 0.4 mg via ORAL
  Filled 2019-07-01 (×2): qty 1

## 2019-07-01 MED ORDER — ACETAMINOPHEN 650 MG RE SUPP: 650.0000 mg | Freq: Four times a day (QID) | RECTAL | Status: DC | PRN

## 2019-07-01 MED ORDER — ONDANSETRON HCL 4 MG/2ML IJ SOLN
INTRAMUSCULAR | Status: DC | PRN
  Administered 2019-07-01: 4 mg via INTRAVENOUS

## 2019-07-01 MED ORDER — OXYCODONE HCL 5 MG PO TABS: 5.0000 mg | ORAL_TABLET | Freq: Once | ORAL | Status: DC | PRN

## 2019-07-01 MED ORDER — ENOXAPARIN SODIUM 40 MG/0.4ML ~~LOC~~ SOLN
40.0000 mg | SUBCUTANEOUS | Status: DC
  Administered 2019-07-01: 40 mg via SUBCUTANEOUS
  Filled 2019-07-01: qty 0.4

## 2019-07-01 MED ORDER — OXYBUTYNIN CHLORIDE 5 MG PO TABS
5.0000 mg | ORAL_TABLET | Freq: Three times a day (TID) | ORAL | Status: DC | PRN
  Administered 2019-07-02: 5 mg via ORAL
  Filled 2019-07-01: qty 1

## 2019-07-01 SURGICAL SUPPLY — 25 items
BAG DRAIN CYSTO-URO LG1000N (MISCELLANEOUS) ×3 IMPLANT
BASKET ZERO TIP 1.9FR (BASKET) ×3 IMPLANT
CATH URETL 5X70 OPEN END (CATHETERS) ×3 IMPLANT
CNTNR SPEC 2.5X3XGRAD LEK (MISCELLANEOUS) ×1
CONRAY 43 FOR UROLOGY 50M (MISCELLANEOUS) IMPLANT
CONT SPEC 4OZ STER OR WHT (MISCELLANEOUS) ×2
CONTAINER SPEC 2.5X3XGRAD LEK (MISCELLANEOUS) ×1 IMPLANT
COVER WAND RF STERILE (DRAPES) IMPLANT
DRAPE UTILITY 15X26 TOWEL STRL (DRAPES) ×3 IMPLANT
FIBER LASER TRAC TIP (UROLOGICAL SUPPLIES) IMPLANT
GLOVE BIO SURGEON STRL SZ8 (GLOVE) ×3 IMPLANT
GOWN STRL REUS W/TWL XL LVL3 (GOWN DISPOSABLE) ×3 IMPLANT
GUIDEWIRE GREEN .038 145CM (MISCELLANEOUS) IMPLANT
GUIDEWIRE STR DUAL SENSOR (WIRE) ×6 IMPLANT
INFUSOR MANOMETER BAG 3000ML (MISCELLANEOUS) ×3 IMPLANT
INTRODUCER DILATOR DOUBLE (INTRODUCER) IMPLANT
KIT TURNOVER CYSTO (KITS) ×3 IMPLANT
PACK CYSTO AR (MISCELLANEOUS) ×3 IMPLANT
SET CYSTO W/LG BORE CLAMP LF (SET/KITS/TRAYS/PACK) ×3 IMPLANT
SHEATH URETERAL 12FRX35CM (MISCELLANEOUS) IMPLANT
SOL .9 NS 3000ML IRR  AL (IV SOLUTION) ×2
SOL .9 NS 3000ML IRR UROMATIC (IV SOLUTION) ×1 IMPLANT
STENT URET 6FRX26 CONTOUR (STENTS) ×3 IMPLANT
SURGILUBE 2OZ TUBE FLIPTOP (MISCELLANEOUS) ×3 IMPLANT
WATER STERILE IRR 1000ML POUR (IV SOLUTION) ×3 IMPLANT

## 2019-07-01 NOTE — Anesthesia Postprocedure Evaluation (Signed)
Anesthesia Post Note  Patient: Anne Haynes  Procedure(s) Performed: URETEROSCOPY WITH HOLMIUM LASER LITHOTRIPSY (Left Ureter) CYSTOSCOPY WITH STENT PLACEMENT (Left Ureter) STONE EXTRACTION WITH BASKET (Left Ureter)  Patient location during evaluation: PACU Anesthesia Type: General Level of consciousness: awake and alert Pain management: pain level controlled Vital Signs Assessment: post-procedure vital signs reviewed and stable Respiratory status: spontaneous breathing, nonlabored ventilation, respiratory function stable and patient connected to nasal cannula oxygen Cardiovascular status: blood pressure returned to baseline and stable Postop Assessment: no apparent nausea or vomiting Anesthetic complications: no     Last Vitals:  Vitals:   07/01/19 1745 07/01/19 2153  BP: (!) 165/80 (!) 141/73  Pulse: 68 95  Resp: 20 16  Temp: 37.3 C 37.6 C  SpO2: 98% 91%    Last Pain:  Vitals:   07/01/19 2153  TempSrc: Oral  PainSc:                  Martha Clan

## 2019-07-01 NOTE — ED Notes (Signed)
Patient resting comfortable at this time on right side. Even and non labored respirations noted. Denies needs. Will continue to monitor.

## 2019-07-01 NOTE — ED Notes (Addendum)
Patient re-swabbed at this time. Patient tolerated well. Q tip end intact post swab.

## 2019-07-01 NOTE — Anesthesia Preprocedure Evaluation (Addendum)
Anesthesia Evaluation  Patient identified by MRN, date of birth, ID band Patient awake    Reviewed: Allergy & Precautions, H&P , NPO status , Patient's Chart, lab work & pertinent test results  History of Anesthesia Complications Negative for: history of anesthetic complications  Airway Mallampati: III  TM Distance: >3 FB Neck ROM: full    Dental  (+) Chipped   Pulmonary neg shortness of breath, former smoker,           Cardiovascular Exercise Tolerance: Good (-) angina(-) Past MI and (-) DOE      Neuro/Psych negative neurological ROS  negative psych ROS   GI/Hepatic negative GI ROS, Neg liver ROS, neg GERD  ,  Endo/Other  negative endocrine ROS  Renal/GU Renal disease     Musculoskeletal   Abdominal   Peds  Hematology negative hematology ROS (+)   Anesthesia Other Findings History reviewed. No pertinent past medical history.  Past Surgical History: No date: BACK SURGERY  BMI    Body Mass Index: 25.85 kg/m      Reproductive/Obstetrics negative OB ROS                             Anesthesia Physical Anesthesia Plan  ASA: II  Anesthesia Plan: General ETT   Post-op Pain Management:    Induction: Intravenous  PONV Risk Score and Plan: Dexamethasone, Ondansetron, Midazolam and Treatment may vary due to age or medical condition  Airway Management Planned: LMA  Additional Equipment:   Intra-op Plan:   Post-operative Plan: Extubation in OR  Informed Consent: I have reviewed the patients History and Physical, chart, labs and discussed the procedure including the risks, benefits and alternatives for the proposed anesthesia with the patient or authorized representative who has indicated his/her understanding and acceptance.     Dental Advisory Given  Plan Discussed with: Anesthesiologist, CRNA and Surgeon  Anesthesia Plan Comments: (Patient consented for risks of  anesthesia including but not limited to:  - adverse reactions to medications - damage to teeth, lips or other oral mucosa - sore throat or hoarseness - Damage to heart, brain, lungs or loss of life  Patient voiced understanding.)       Anesthesia Quick Evaluation

## 2019-07-01 NOTE — Anesthesia Post-op Follow-up Note (Signed)
Anesthesia QCDR form completed.        

## 2019-07-01 NOTE — ED Notes (Signed)
Pt reports she was diagnosed with a "large" kidney stone on Monday here in ED and pt went home on Percocet but sts, this AM at approx. 0300 that the pain returned and was unmanageable.

## 2019-07-01 NOTE — ED Notes (Signed)
Patient transported to OR.

## 2019-07-01 NOTE — H&P (Addendum)
[facility] [place] [facility]   PATIENT NAME: Anne Haynes Sando    MR#:  [id number]  DATE OF BIRTH:  12/27/1965  DATE OF ADMISSION:  07/01/2019  PRIMARY CARE PHYSICIAN: Patient, No Pcp Per   REQUESTING/REFERRING PHYSICIAN: Dionne BucySiadecki, Sebastian, MD  CHIEF COMPLAINT:   Chief Complaint  Patient presents with  . Flank Pain   Left flank pain. HISTORY OF PRESENT ILLNESS:  Anne Haynes Salminen  is a 53 y.o. female with a known history of cervical facet joint arthropathy, major depression, endometrial mass and ADHD.  The patient has had left-sided abdominal pain and flank pain for the past 2 days.  She also complains for nausea, vomiting but no diarrhea melena or bloody stool.  She denies any dysuria or hematuria.  She is found the left side kidney stone and hydronephrosis.  ED physician discussed with urologist Dr. Lonna CobbStoioff, who plans for stent placement. PAST MEDICAL HISTORY:  History reviewed. No pertinent past medical history. cervical facet joint arthropathy, major depression, endometrial mass and ADHD. PAST SURGICAL HISTORY:   Past Surgical History:  Procedure Laterality Date  . BACK SURGERY      SOCIAL HISTORY:   Social History   Tobacco Use  . Smoking status: Former Games developermoker  . Smokeless tobacco: Former Engineer, waterUser  Substance Use Topics  . Alcohol use: Yes    FAMILY HISTORY:  No family history on file.  The patient is adopted.  DRUG ALLERGIES:  No Known Allergies  REVIEW OF SYSTEMS:   Review of Systems  Constitutional: Negative for chills, fever and malaise/fatigue.  HENT: Negative for sore throat.   Eyes: Negative for blurred vision and double vision.  Respiratory: Negative for cough, hemoptysis, shortness of breath, wheezing and stridor.   Cardiovascular: Negative for chest pain, palpitations, orthopnea and leg swelling.  Gastrointestinal: Positive for abdominal pain, nausea and vomiting. Negative for blood in stool, diarrhea and melena.  Genitourinary:  Positive for flank pain. Negative for dysuria and hematuria.  Musculoskeletal: Negative for back pain and joint pain.  Skin: Negative for rash.  Neurological: Negative for dizziness, sensory change, focal weakness, seizures, loss of consciousness, weakness and headaches.  Endo/Heme/Allergies: Negative for polydipsia.  Psychiatric/Behavioral: Negative for depression. The patient is not nervous/anxious.     MEDICATIONS AT HOME:   Prior to Admission medications   Medication Sig Start Date End Date Taking? Authorizing Provider  acetaminophen (TYLENOL) 650 MG CR tablet Take 650 mg by mouth every 8 (eight) hours as needed for pain.   Yes [provider]  ALPRAZolam Prudy Feeler(XANAX) 0.5 MG tablet Take 0.5 mg by mouth 3 (three) times daily as needed. 03/14/19  Yes [provider]  dextroamphetamine (DEXEDRINE SPANSULE) 15 MG 24 hr capsule Take 30 mg by mouth 2 (two) times daily. 06/17/19  Yes [provider]  ibuprofen (ADVIL) 600 MG tablet Take 1 tablet (600 mg total) by mouth every 6 (six) hours as needed for moderate pain. 06/30/19  Yes Shaune PollackIsaacs, Cameron, MD  ondansetron (ZOFRAN ODT) 4 MG disintegrating tablet Take 1 tablet (4 mg total) by mouth every 8 (eight) hours as needed for nausea or vomiting. 06/30/19  Yes Shaune PollackIsaacs, Cameron, MD  oxyCODONE-acetaminophen (PERCOCET) 5-325 MG tablet Take 1-2 tablets by mouth every 6 (six) hours as needed for moderate pain or severe pain. 06/30/19 06/29/20 Yes Shaune PollackIsaacs, Cameron, MD      VITAL SIGNS:  Blood pressure (!) 177/97, pulse 72, temperature 98.6 F (37 C), temperature source Oral, resp. rate 17, height 5\' 8"  (1.727  m), weight 77.1 kg, SpO2 96 %.  PHYSICAL EXAMINATION:  Physical Exam  GENERAL:  53 y.o.-year-old patient lying in the bed with no acute distress.  EYES: Pupils equal, round, reactive to light and accommodation. No scleral icterus. Extraocular muscles intact.  HEENT: Head atraumatic, normocephalic. NECK:  Supple, no jugular  venous distention. No thyroid enlargement, no tenderness.  LUNGS: Normal breath sounds bilaterally, no wheezing, rales,rhonchi or crepitation. No use of accessory muscles of respiration.  CARDIOVASCULAR: S1, S2 normal. No murmurs, rubs, or gallops.  ABDOMEN: Soft, nontender, nondistended. Bowel sounds present. No organomegaly or mass.  EXTREMITIES: No pedal edema, cyanosis, or clubbing.  NEUROLOGIC: Cranial nerves II through XII are intact. Muscle strength 5/5 in all extremities. Sensation intact. Gait not checked.  PSYCHIATRIC: The patient is alert and oriented x 3.  SKIN: No obvious rash, lesion, or ulcer.   LABORATORY PANEL:   CBC Recent Labs  Lab 07/01/19 0749  WBC 16.4*  HGB 15.1*  HCT 44.8  PLT 322   ------------------------------------------------------------------------------------------------------------------  Chemistries  Recent Labs  Lab 06/30/19 0841 07/01/19 0940  NA 138 141  K 3.7 4.1  CL 106 109  CO2 28 23  GLUCOSE 107* 121*  BUN 16 13  CREATININE 0.73 0.87  CALCIUM 11.8* 11.8*  AST 18  --   ALT 19  --   ALKPHOS 78  --   BILITOT 0.8  --    ------------------------------------------------------------------------------------------------------------------  Cardiac Enzymes No results for input(s): TROPONINI in the last 168 hours. ------------------------------------------------------------------------------------------------------------------  RADIOLOGY:  Dg Elbow Complete Left  Result Date: 06/30/2019 CLINICAL DATA:  Left elbow pain EXAM: LEFT ELBOW - COMPLETE 3+ VIEW COMPARISON:  None. FINDINGS: There is no evidence of fracture, dislocation, or joint effusion. There is no evidence of arthropathy or other focal bone abnormality. Soft tissues are unremarkable. IMPRESSION: Negative. Electronically Signed   By: Charlett Nose M.D.   On: 06/30/2019 10:26   Ct Abdomen Pelvis W Contrast  Result Date: 06/30/2019 CLINICAL DATA:  Acute left lower quadrant  abdominal pain. EXAM: CT ABDOMEN AND PELVIS WITH CONTRAST TECHNIQUE: Multidetector CT imaging of the abdomen and pelvis was performed using the standard protocol following bolus administration of intravenous contrast. CONTRAST:  OMNIPAQUE IOHEXOL 300 MG/ML  SOLN COMPARISON:  None. FINDINGS: Lower chest: No acute abnormality. Hepatobiliary: No focal liver abnormality is seen. No gallstones, gallbladder wall thickening, or biliary dilatation. Pancreas: Unremarkable. No pancreatic ductal dilatation or surrounding inflammatory changes. Spleen: Normal in size without focal abnormality. Kidneys: Adrenal glands are unremarkable. Right kidney and ureter are unremarkable. Moderate left hydronephrosis is noted secondary to 8 mm calculus at the left ureteropelvic junction. Mild left ureteral dilatation is noted secondary to 4 mm calculus at the left ureterovesical junction. Urinary bladder is otherwise unremarkable. Stomach/Bowel: Stomach is within normal limits. Appendix appears normal. No evidence of bowel wall thickening, distention, or inflammatory changes. Vascular/Lymphatic: No significant vascular findings are present. No enlarged abdominal or pelvic lymph nodes. Reproductive: Uterus and bilateral adnexa are unremarkable. Other: No abdominal wall hernia or abnormality. No abdominopelvic ascites. Musculoskeletal: No acute or significant osseous findings. IMPRESSION: Moderate left hydronephrosis is noted secondary to 8 mm calculus at the left ureteropelvic junction. Mild left ureteral dilatation is noted secondary to 4 mm calculus at the left ureterovesical junction. Electronically Signed   By: Lupita Raider M.D.   On: 06/30/2019 10:13      IMPRESSION AND PLAN:   Nephrolithiasis with moderate left hydronephrosis  The patient will be placed for  observation. Pain control, follow-up urologist Dr. Bernardo Heater for stent placement.  Follow-up COVID-19 test.  Leukocytosis.  Possible due to reaction, follow-up  CBC.  All the records are reviewed and case discussed with ED provider. Management plans discussed with the patient, family and they are in agreement.  CODE STATUS:   TOTAL TIME TAKING CARE OF THIS PATIENT: 46 minutes.    Demetrios Loll M.D on 07/01/2019 at 11:36 AM  Between 7am to 6pm - Pager - [phone]  After 6pm go to [email or web address] - Proofreader  [facility] [place] [facility]  Office  [phone]  CC: Primary care physician; Patient, No Pcp Per   Note: This dictation was prepared with Dragon dictation along with smaller phrase technology. Any transcriptional errors that result from this process are unin

## 2019-07-01 NOTE — ED Notes (Signed)
Patient reports her pain is starting to come back. Provider notified and aware.

## 2019-07-01 NOTE — Op Note (Signed)
Preoperative diagnosis:  1.  Left distal ureteral calculus 2.  Left UPJ calculus  Postoperative diagnosis:  Same  Procedure:  1. Cystoscopy 2. Left ureteroscopy with basket extraction distal ureteral calculus 3. Left ureteral stent placement (6FR/26 cm) 4. Left retrograde pyelography with interpretation  Surgeon: Nicki Reaper C. , M.D.  Anesthesia: General  Complications: None  Intraoperative findings:  1.  Left distal ureteral calculus 2.  Left renal pelvic calculus  EBL: Minimal  Specimens: 1. Calculus fragments for analysis 2. Urine left renal pelvis for culture   Indication: Anne Haynes is a 53 y.o. year old patient with severe left renal colic failing outpatient management.  CT showed an 8 mm left UPJ calculus and 4 mm left UVJ calculus with moderate hydronephrosis and mild to moderate hydroureter.  Although afebrile her WBC had increased to 16,000 with left shift between ED visits yesterday and today.  After reviewing the management options for treatment, the patient elected to proceed with the above surgical procedure(s). We have discussed the potential benefits and risks of the procedure, side effects of the proposed treatment, the likelihood of the patient achieving the goals of the procedure, and any potential problems that might occur during the procedure or recuperation. Informed consent has been obtained.  Description of procedure:  The patient was taken to the operating room and general anesthesia was induced.  The patient was placed in the dorsal lithotomy position, prepped and draped in the usual sterile fashion, and preoperative antibiotics were administered. A preoperative time-out was performed.   A 21 French cystoscope was lubricated and passed per urethra.  Panendoscopy was performed and the bladder mucosa showed no erythema, solid or papillary lesions.  Attention was directed to the left ureteral orifice and a 0.038 Sensor wire was then advanced up the  ureter into the renal pelvis under fluoroscopic guidance.  A 4.5 Fr semirigid ureteroscope was then advanced into the ureter next to the guidewire and the calculus was identified in the distal ureter.  The calculus was placed in a 1.9 French nitinol basket and removed without difficulty.  The ureteroscope was repassed and advanced up the ureter and no additional calculi or fragments were notified.  A second guidewire was placed with ureteroscope and advanced into the renal pelvis under fluoroscopic guidance.  A 5 French open-ended ureteral catheter was placed over the second guidewire and the guidewire was removed.  Urine was aspirated which was turbid in appearance.  Based on the appearance of the urine and her leukocytosis it was elected to place a ureteral stent and lieu of laser lithotripsy/stone removal of the UPJ calculus.  The ureteral catheter was removed.  The safety wire was backloaded on the cystoscope and a 6 French/26 cm Contour ureteral stent was placed. The wire was then removed with an adequate stent curl noted in the renal pelvis as well as in the bladder.  The bladder was then emptied and the procedure ended.  The patient appeared to tolerate the procedure well and without complications.  After anesthetic reversal the patient was transported to the PACU in stable condition.   Plan: We will tentatively plan shockwave lithotripsy of her left UPJ calculus once urine culture returns.   John Giovanni, MD

## 2019-07-01 NOTE — Consult Note (Signed)
Urology Consult  Chief Complaint: Flank pain  History of Present Illness: Anne Haynes is a 53 y.o. year old female seen in consultation at the request of Dr. Imogene Burn for evaluation of a left renal colic.  She presented to the ED yesterday morning with acute onset of severe left flank pain radiating to the left lower quadrant.  There were no identifiable precipitating, aggravating or alleviating factors.  She had nausea with vomiting.  Denied fever or chills.  Denies urinary frequency, urgency, dysuria or gross hematuria. A stone protocol CT of the abdomen pelvis was performed which showed an 8 mm left UPJ calculus and a 4 mm left UVJ calculus.  There was moderate left hydronephrosis and mild left hydroureter to the distal ureter.  Her pain resolved with parenteral analgesia and she was discharged on oxycodone with instructions to follow-up with her PCP.  She denies prior history of stone disease.  She was noted to have hypercalcemia at 11.8.  She developed recurrent pain last night which was not controlled with p.o. analgesics and had persistent nausea and vomiting and came back to the ED this morning.  Her pain was initially controlled with narcotic analgesics and ketorolac though recurred and was difficult to control.  She was admitted to the hospital service for pain control.  She was noted to have leukocytosis today.  She denies fever or chills.   Past medical history: Depression ADHD  Past Surgical History:  Procedure Laterality Date  . BACK SURGERY      Home Medications:  Current Meds  Medication Sig  . acetaminophen (TYLENOL) 650 MG CR tablet Take 650 mg by mouth every 8 (eight) hours as needed for pain.  Marland Kitchen ALPRAZolam (XANAX) 0.5 MG tablet Take 0.5 mg by mouth 3 (three) times daily as needed.  Marland Kitchen dextroamphetamine (DEXEDRINE SPANSULE) 15 MG 24 hr capsule Take 30 mg by mouth 2 (two) times daily.  Marland Kitchen ibuprofen (ADVIL) 600 MG tablet Take 1 tablet (600 mg total) by mouth every 6  (six) hours as needed for moderate pain.  Marland Kitchen ondansetron (ZOFRAN ODT) 4 MG disintegrating tablet Take 1 tablet (4 mg total) by mouth every 8 (eight) hours as needed for nausea or vomiting.  Marland Kitchen oxyCODONE-acetaminophen (PERCOCET) 5-325 MG tablet Take 1-2 tablets by mouth every 6 (six) hours as needed for moderate pain or severe pain.    Allergies: No Known Allergies  No family history on file.  Social History:  reports that she has quit smoking. She has quit using smokeless tobacco. She reports current alcohol use. She reports previous drug use.  ROS: A complete review of systems was performed.  All systems are negative except for pertinent findings as noted.  Physical Exam:  Vital signs in last 24 hours: Temp:  [98.6 F (37 C)] 98.6 F (37 C) (10/13 0645) Pulse Rate:  [68-91] 86 (10/13 1330) Resp:  [15-20] 17 (10/13 1330) BP: (118-196)/(72-97) 161/92 (10/13 1330) SpO2:  [94 %-100 %] 95 % (10/13 1330) Weight:  [77.1 kg] 77.1 kg (10/13 0642) Constitutional:  Alert and oriented, No acute distress HEENT: [place] AT, moist mucus membranes.  Trachea midline, no masses Cardiovascular: Regular rate and rhythm, no clubbing, cyanosis, or edema.  RRR Respiratory: Normal respiratory effort, lungs clear bilaterally, clear GI: Abdomen is soft, nontender, nondistended, no abdominal masses GU: No CVA tenderness Skin: No rashes, bruises or suspicious lesions Lymph: No cervical or inguinal adenopathy Neurologic: Grossly intact, no focal deficits, moving all 4 extremities Psychiatric: Normal mood and  affect   Laboratory Data:  Recent Labs    06/30/19 0841 07/01/19 0749  WBC 10.6* 16.4*  HGB 15.5* 15.1*  HCT 46.1* 44.8   Recent Labs    06/30/19 0841 07/01/19 0940  NA 138 141  K 3.7 4.1  CL 106 109  CO2 28 23  GLUCOSE 107* 121*  BUN 16 13  CREATININE 0.73 0.87  CALCIUM 11.8* 11.8*     Radiologic Imaging: CT personally reviewed Dg Elbow Complete Left  Result Date:  06/30/2019 CLINICAL DATA:  Left elbow pain EXAM: LEFT ELBOW - COMPLETE 3+ VIEW COMPARISON:  None. FINDINGS: There is no evidence of fracture, dislocation, or joint effusion. There is no evidence of arthropathy or other focal bone abnormality. Soft tissues are unremarkable. IMPRESSION: Negative. Electronically Signed   By: Charlett NoseKevin  Dover M.D.   On: 06/30/2019 10:26   Ct Abdomen Pelvis W Contrast  Result Date: 06/30/2019 CLINICAL DATA:  Acute left lower quadrant abdominal pain. EXAM: CT ABDOMEN AND PELVIS WITH CONTRAST TECHNIQUE: Multidetector CT imaging of the abdomen and pelvis was performed using the standard protocol following bolus administration of intravenous contrast. CONTRAST:  100mL OMNIPAQUE IOHEXOL 300 MG/ML  SOLN COMPARISON:  None. FINDINGS: Lower chest: No acute abnormality. Hepatobiliary: No focal liver abnormality is seen. No gallstones, gallbladder wall thickening, or biliary dilatation. Pancreas: Unremarkable. No pancreatic ductal dilatation or surrounding inflammatory changes. Spleen: Normal in size without focal abnormality. Kidneys: Adrenal glands are unremarkable. Right kidney and ureter are unremarkable. Moderate left hydronephrosis is noted secondary to 8 mm calculus at the left ureteropelvic junction. Mild left ureteral dilatation is noted secondary to 4 mm calculus at the left ureterovesical junction. Urinary bladder is otherwise unremarkable. Stomach/Bowel: Stomach is within normal limits. Appendix appears normal. No evidence of bowel wall thickening, distention, or inflammatory changes. Vascular/Lymphatic: No significant vascular findings are present. No enlarged abdominal or pelvic lymph nodes. Reproductive: Uterus and bilateral adnexa are unremarkable. Other: No abdominal wall hernia or abnormality. No abdominopelvic ascites. Musculoskeletal: No acute or significant osseous findings. IMPRESSION: Moderate left hydronephrosis is noted secondary to 8 mm calculus at the left ureteropelvic  junction. Mild left ureteral dilatation is noted secondary to 4 mm calculus at the left ureterovesical junction. Electronically Signed   By: Lupita RaiderJames  Green Jr M.D.   On: 06/30/2019 10:13    Impression:  53 y.o. female with left renal colic which is failed outpatient management.  She has a 4 mm UVJ calculus and an 8 mm UPJ calculus.  She has been admitted to the hospital service for pain control.  The following management options were discussed:  -Proceed with a left ureteroscopy/stent placement today.  She has developed leukocytosis with a left shift.  If urine from the renal pelvis looked purulent would not attempt treatment of her UPJ calculus and she would need follow-up ureteroscopy or shockwave lithotripsy.  -Parenteral analgesia until shockwave lithotripsy could be performed on 10/15  -Medical expulsion therapy however unlikely her UPJ calculus would pass   Recommendation:  She has elected to proceed with ureteroscopy today.  Will plan on treating both stones and less urine from the renal pelvis is cloudy or purulent.  The indications and nature of the planned procedure were discussed as well as the potential  benefits and expected outcome.  Alternatives have been discussed in detail. The most common complications and side effects were discussed including but not limited to infection/sepsis; blood loss; damage to urethra, bladder, ureter, kidney; need for multiple surgeries; need for prolonged stent  placement as well as general anesthesia risks. Although uncommon she was also informed of the possibility that the UPJ calculus may not be able to be treated due to inability to obtain access to the upper ureter. In that event she would require stent placement and subsequent shockwave lithotripsy or a follow-up procedure after a period of stent dilation. All of her questions were answered and she desires to proceed.    07/01/2019, 2:01 PM  John Giovanni,  MD

## 2019-07-01 NOTE — ED Provider Notes (Addendum)
[facility] Emergency Department Provider Note ____________________________________________   First MD Initiated Contact with Patient 07/01/19 0715     (approximate)  I have reviewed the triage vital signs and the nursing notes.   HISTORY  Chief Complaint Flank Pain    HPI Anne Haynes is a 54 y.o. female with PMH as noted below who presents with left flank pain, persistent course since last night, radiating towards the left lower abdomen.  The patient states that she was diagnosed with a kidney stone yesterday and was sent home with pain medication, however since last night the pain has been unmanageable at home.  She reports nausea and vomiting.  She denies fever or diarrhea.  History reviewed. No pertinent past medical history.  Patient Active Problem List   Diagnosis Date Noted   Hydronephrosis, left 07/01/2019    Past Surgical History:  Procedure Laterality Date   BACK SURGERY      Prior to Admission medications   Medication Sig Start Date End Date Taking? Authorizing Provider  acetaminophen (TYLENOL) 650 MG CR tablet Take 650 mg by mouth every 8 (eight) hours as needed for pain.   Yes [provider]  ALPRAZolam Prudy Feeler) 0.5 MG tablet Take 0.5 mg by mouth 3 (three) times daily as needed. 03/14/19  Yes [provider]  dextroamphetamine (DEXEDRINE SPANSULE) 15 MG 24 hr capsule Take 30 mg by mouth 2 (two) times daily. 06/17/19  Yes [provider]  ibuprofen (ADVIL) 600 MG tablet Take 1 tablet (600 mg total) by mouth every 6 (six) hours as needed for moderate pain. 06/30/19  Yes Shaune Pollack, MD  ondansetron (ZOFRAN ODT) 4 MG disintegrating tablet Take 1 tablet (4 mg total) by mouth every 8 (eight) hours as needed for nausea or vomiting. 06/30/19  Yes Shaune Pollack, MD  oxyCODONE-acetaminophen (PERCOCET) 5-325 MG tablet Take 1-2 tablets by mouth every 6 (six) hours as needed for moderate pain or severe pain. 06/30/19  06/29/20 Yes Shaune Pollack, MD    Allergies Patient has no known allergies.  No family history on file.  Social History Social History   Tobacco Use   Smoking status: Former Smoker   Smokeless tobacco: Former Engineer, water Use Topics   Alcohol use: Yes   Drug use: Not Currently    Review of Systems  Constitutional: No fever/chills. Eyes: No redness. ENT: No sore throat. Cardiovascular: Denies chest pain. Respiratory: Denies shortness of breath. Gastrointestinal: Positive for nausea. Genitourinary: Positive for flank pain.  Musculoskeletal: Positive for back pain. Skin: Negative for rash. Neurological: Negative for headache.   ____________________________________________   PHYSICAL EXAM:  VITAL SIGNS: ED Triage Vitals  Enc Vitals Group     BP 07/01/19 0645 (!) 147/90     Pulse Rate 07/01/19 0645 77     Resp 07/01/19 0645 20     Temp 07/01/19 0645 98.6 F (37 C)     Temp Source 07/01/19 0645 Oral     SpO2 07/01/19 0645 100 %     Weight 07/01/19 0642 170 lb (77.1 kg)     Height 07/01/19 0642 5\' 8"  (1.727 m)     Head Circumference --      Peak Flow --      Pain Score 07/01/19 0642 10     Pain Loc --      Pain Edu? --      Excl. in GC? --     Constitutional: Alert and oriented.  Uncomfortable appearing but in no acute  distress. Eyes: Conjunctivae are normal.  Head: Atraumatic. Nose: No congestion/rhinnorhea. Mouth/Throat: Mucous membranes are moist.   Neck: Normal range of motion.  Cardiovascular: Good peripheral circulation. Respiratory: Normal respiratory effort.  No retractions.  Gastrointestinal: Soft with no significant tenderness.  No distention.  Genitourinary: Left CVA tenderness. Musculoskeletal: Extremities warm and well perfused.  Neurologic:  Normal speech and language. No gross focal neurologic deficits are appreciated.  Skin:  Skin is warm and dry. No rash noted. Psychiatric: Mood and affect are normal. Speech and behavior are  normal.  ____________________________________________   LABS (all labs ordered are listed, but only abnormal results are displayed)  Labs Reviewed  CBC WITH DIFFERENTIAL/PLATELET - Abnormal; Notable for the following components:      Result Value   WBC 16.4 (*)    Hemoglobin 15.1 (*)    Neutro Abs 13.7 (*)    All other components within normal limits  BASIC METABOLIC PANEL - Abnormal; Notable for the following components:   Glucose, Bld 121 (*)    Calcium 11.8 (*)    All other components within normal limits  SARS CORONAVIRUS 2 (TAT 6-24 HRS)   ____________________________________________  EKG   ____________________________________________  RADIOLOGY    ____________________________________________   PROCEDURES  Procedure(s) performed: Yes  Fiberoptic laryngoscopy  Date/Time: 07/01/2019 12:00 PM Performed by: Arta Silence, MD Authorized by: Arta Silence, MD  Consent: Verbal consent obtained. Risks and benefits: risks, benefits and alternatives were discussed Consent given by: patient Patient identity confirmed: verbally with patient and arm band Time out: Immediately prior to procedure a "time out" was called to verify the correct patient, procedure, equipment, support staff and site/side marked as required. Local anesthesia used: no  Anesthesia: Local anesthesia used: no  Sedation: Patient sedated: no  Patient tolerance: patient tolerated the procedure well with no immediate complications Comments: No foreign object visualized on full exam of the nasopharynx, oropharynx and vocal cords.     Critical Care performed: No ____________________________________________   INITIAL IMPRESSION / ASSESSMENT AND PLAN / ED COURSE  Pertinent labs & imaging results that were available during my care of the patient were reviewed by me and considered in my medical decision making (see chart for details).  53 year old female with PMH as noted above  presents with persistent left flank pain during the night after being diagnosed with a ureteral stone yesterday.  The patient states that she was doing well yesterday afternoon until the pain started again during the night and was not relieved by the Percocet she was prescribed.  I reviewed the past medical records in epic.  The patient was seen in the ED yesterday.  Her pain was well controlled with Dilaudid and Toradol.  CT revealed an 8 mm stone at the left UPJ and a 4 mm stone at the UVJ, with moderate hydronephrosis.  Presentation is consistent with continued pain due to these relatively large stones.  I will give fluids, analgesia, check the patient's creatinine, and likely consult urology as the patient may need procedural intervention.  ----------------------------------------- 11:36 AM on 07/01/2019 -----------------------------------------  I discussed the case with Dr. Bernardo Heater from urology who advised that if the patient's pain is controlled she could potentially get a lithotripsy as an outpatient this week.  However if she has refractory pain she would need admission and he can evaluate her for possible stenting.  The patient initially had good relief with the Dilaudid and Toradol, but now has recurrence of relatively severe pain and appears somewhat uncomfortable.  Based  on discussion with her, I proceeded with admission.  I signed the patient out to the hospitalist Dr. Imogene Burnhen and inform Dr. Lonna CobbStoioff.  The lab work-up is unremarkable except for elevated WBC count which is likely a stress response as the patient has no signs or symptoms of infection and her UA yesterday showed no significant findings to suggest UTI.  ----------------------------------------- 12:31 PM on 07/01/2019 -----------------------------------------  After being admitted, the patient had a COVID-19 swab done.  Per the RN, when the swab was being done the patient moved her head.  When the swab was pulled out, the  tip was missing.  It is unclear if it possibly fell onto the bed or if the patient might have swallowed it.  The patient has no pain or discomfort.  She has no bleeding and no shortness of breath.  I contacted Dr. Jenne CampusMcqueen from ENT who recommended that I perform fiberoptic laryngoscopy and consult him if I can identify the swab tip and cannot remove it.  However, he advised that if the patient is asymptomatic she may already have swallowed it.  I performed fiberoptic laryngoscopy.  The patient tolerated it and there were no complications.  I was able to visualize the nasopharynx and posterior oropharynx.  There is no evidence of any foreign body.  The patient was asymptomatic after the procedure.  ____________________________  Liliane BadeLinda Weseman was evaluated in Emergency Department on 07/01/2019 for the symptoms described in the history of present illness. She was evaluated in the context of the global COVID-19 pandemic, which necessitated consideration that the patient might be at risk for infection with the SARS-CoV-2 virus that causes COVID-19. Institutional protocols and algorithms that pertain to the evaluation of patients at risk for COVID-19 are in a state of rapid change based on information released by regulatory bodies including the CDC and federal and state organizations. These policies and algorithms were followed during the patient's care in the ED.  ____________________________________________   FINAL CLINICAL IMPRESSION(S) / ED DIAGNOSES  Final diagnoses:  Ureteral stone      NEW MEDICATIONS STARTED DURING THIS VISIT:  New Prescriptions   No medications on file     Note:  This document was prepared using Dragon voice recognition software and may include unintentional dictation errors.    Dionne BucySiadecki, Zyren Sevigny, MD 07/01/19 1138    Dionne BucySiadecki, Uchenna Seufert, MD 07/01/19 1232

## 2019-07-01 NOTE — Anesthesia Procedure Notes (Signed)
Procedure Name: Intubation Date/Time: 07/01/2019 3:09 PM Performed by: Earna Coder, RN Pre-anesthesia Checklist: Patient identified, Emergency Drugs available, Suction available and Patient being monitored Patient Re-evaluated:Patient Re-evaluated prior to induction Oxygen Delivery Method: Circle system utilized Preoxygenation: Pre-oxygenation with 100% oxygen Induction Type: IV induction Ventilation: Mask ventilation without difficulty Laryngoscope Size: McGraph and 3 (not used for difficult airway) Grade View: Grade I Tube type: Oral Tube size: 7.0 mm Number of attempts: 1 Airway Equipment and Method: Stylet and Oral airway Placement Confirmation: ETT inserted through vocal cords under direct vision,  positive ETCO2 and breath sounds checked- equal and bilateral Secured at: 20 cm Tube secured with: Tape Dental Injury: Teeth and Oropharynx as per pre-operative assessment

## 2019-07-01 NOTE — ED Triage Notes (Signed)
Pt to triage via w/c, mask in place; St here yesterday for kidney stone; c/o persistent pain to left lower abd radiating into flank accomp by nausea

## 2019-07-01 NOTE — ED Notes (Addendum)
Post swabbing patients right nare for COVID test, Q tip was removed. Q tip was found to not be intact. Estimated 1 inch of top of swab is not present. Vital signs remain stable. No respiratory distress noted. Patient denies sensation of foreign body. Even and non labored respirations noted. ED Attending Siadecki MD notified and at bedside.

## 2019-07-01 NOTE — Transfer of Care (Signed)
Immediate Anesthesia Transfer of Care Note  Patient: Anne Haynes  Procedure(s) Performed: URETEROSCOPY WITH HOLMIUM LASER LITHOTRIPSY (Left Ureter) CYSTOSCOPY WITH STENT PLACEMENT (Left Ureter) STONE EXTRACTION WITH BASKET (Left Ureter)  Patient Location: PACU  Anesthesia Type:General  Level of Consciousness: sedated  Airway & Oxygen Therapy: Patient Spontanous Breathing and Patient connected to face mask oxygen  Post-op Assessment: Report given to RN and Post -op Vital signs reviewed and stable  Post vital signs: Reviewed and stable  Last Vitals:  Vitals Value Taken Time  BP 121/79 07/01/19 1559  Temp 36.4 C 07/01/19 1558  Pulse 95 07/01/19 1559  Resp 19 07/01/19 1559  SpO2 100 % 07/01/19 1559  Vitals shown include unvalidated device data.  Last Pain:  Vitals:   07/01/19 1558  TempSrc: Temporal  PainSc: 0-No pain         Complications: No apparent anesthesia complications

## 2019-07-01 NOTE — ED Notes (Signed)
This RN called lab on behalf of primary RN, Terrence Dupont, lab notified that Covid test changed to rapid instead of 6-24. Per Levada Dy in lab, lab will be able to run rapid Covid test off of swab sent to lab previously by Terrence Dupont, Therapist, sports.

## 2019-07-01 NOTE — ED Notes (Addendum)
Siadecki MD at bedside with laryngoscope. Patient tolerated well. No retained swab present per MD.

## 2019-07-02 ENCOUNTER — Telehealth: Payer: Self-pay | Admitting: Physician Assistant

## 2019-07-02 ENCOUNTER — Encounter: Payer: Self-pay | Admitting: Urology

## 2019-07-02 DIAGNOSIS — N201 Calculus of ureter: Secondary | ICD-10-CM | POA: Diagnosis not present

## 2019-07-02 LAB — CBC
HCT: 43.5 % (ref 36.0–46.0)
Hemoglobin: 14.9 g/dL (ref 12.0–15.0)
MCH: 31.3 pg (ref 26.0–34.0)
MCHC: 34.3 g/dL (ref 30.0–36.0)
MCV: 91.4 fL (ref 80.0–100.0)
Platelets: 361 10*3/uL (ref 150–400)
RBC: 4.76 MIL/uL (ref 3.87–5.11)
RDW: 11.4 % — ABNORMAL LOW (ref 11.5–15.5)
WBC: 16 10*3/uL — ABNORMAL HIGH (ref 4.0–10.5)
nRBC: 0 % (ref 0.0–0.2)

## 2019-07-02 LAB — HIV ANTIBODY (ROUTINE TESTING W REFLEX): HIV Screen 4th Generation wRfx: NONREACTIVE

## 2019-07-02 LAB — BASIC METABOLIC PANEL
Anion gap: 10 (ref 5–15)
BUN: 9 mg/dL (ref 6–20)
CO2: 24 mmol/L (ref 22–32)
Calcium: 12.1 mg/dL — ABNORMAL HIGH (ref 8.9–10.3)
Chloride: 108 mmol/L (ref 98–111)
Creatinine, Ser: 0.76 mg/dL (ref 0.44–1.00)
GFR calc Af Amer: >60 mL/min (ref >60)
GFR calc non Af Amer: >60 mL/min (ref >60)
Glucose, Bld: 125 mg/dL — ABNORMAL HIGH (ref 70–99)
Potassium: 4 mmol/L (ref 3.5–5.1)
Sodium: 142 mmol/L (ref 135–145)

## 2019-07-02 MED ORDER — OXYBUTYNIN CHLORIDE 5 MG PO TABS: 5.0000 mg | ORAL_TABLET | Freq: Three times a day (TID) | ORAL | 0 refills | Status: DC | PRN

## 2019-07-02 MED ORDER — IBUPROFEN 600 MG PO TABS: 600.0000 mg | ORAL_TABLET | Freq: Four times a day (QID) | ORAL | 0 refills | Status: AC | PRN

## 2019-07-02 MED ORDER — CEPHALEXIN 250 MG PO CAPS: 250.0000 mg | ORAL_CAPSULE | Freq: Two times a day (BID) | ORAL | 0 refills | Status: AC

## 2019-07-02 MED ORDER — TAMSULOSIN HCL 0.4 MG PO CAPS: 0.4000 mg | ORAL_CAPSULE | Freq: Every day | ORAL | 0 refills | Status: DC

## 2019-07-02 NOTE — Telephone Encounter (Signed)
Please schedule this patient for clinic follow-up to discuss stent removal/stone management within the next week.

## 2019-07-02 NOTE — Discharge Instructions (Signed)

## 2019-07-02 NOTE — Progress Notes (Signed)
Urology Inpatient Progress Note  Subjective: Anne Haynes is a 53 y.o. female who is POD1 from cystoscopy and left ureteroscopy with basket extraction of a 4 mm distal left ureteral calculus, left ureteral stent placement, and left retrograde pyelography with interpretation with Dr. Bernardo Heater.  She still has an 8 mm proximal left ureteral stone in place.  Intraoperative findings of turbid urine concerning for urinary tract infection, particularly in light of leukocytosis with left shift.  Urine cultures pending, on ceftriaxone as below..  Patient is afebrile and hypertensive today.  Creatinine 0.76, down from 0.87 yesterday.  WBC count 16.0 today, 16.4 yesterday.  She reports significant improvement in her pain from 2 days ago.  However, she does note some left-sided flank discomfort that she associates with the stent.  She is spontaneously voiding, however she does report some urinary incontinence at baseline.  Anti-infectives: Anti-infectives (From admission, onward)   Start     Dose/Rate Route Frequency Ordered Stop   07/01/19 1400  cefTRIAXone (ROCEPHIN) 1 g in sodium chloride 0.9 % 100 mL IVPB     1 g 200 mL/hr over 30 Minutes Intravenous  Once 07/01/19 1349 07/01/19 1455      Current Facility-Administered Medications  Medication Dose Route Frequency Provider Last Rate Last Dose  . 0.9 %  sodium chloride infusion   Intravenous Continuous Demetrios Loll, MD 100 mL/hr at 07/02/19 0803    . acetaminophen (TYLENOL) tablet 650 mg  650 mg Oral Q6H PRN Demetrios Loll, MD   650 mg at 07/01/19 1807   Or  . acetaminophen (TYLENOL) suppository 650 mg  650 mg Rectal Q6H PRN Demetrios Loll, MD      . albuterol (PROVENTIL) (2.5 MG/3ML) 0.083% nebulizer solution 2.5 mg  2.5 mg Nebulization Q2H PRN Demetrios Loll, MD      . ALPRAZolam Duanne Moron) tablet 0.5 mg  0.5 mg Oral TID PRN Demetrios Loll, MD      . bisacodyl (DULCOLAX) EC tablet 5 mg  5 mg Oral Daily PRN Demetrios Loll, MD      . dextroamphetamine (DEXEDRINE SPANSULE)  24 hr capsule 30 mg  30 mg Oral BID Benita Gutter, RPH   30 mg at 07/02/19 0857  . enoxaparin (LOVENOX) injection 40 mg  40 mg Subcutaneous Q24H Demetrios Loll, MD   40 mg at 07/01/19 2039  . HYDROcodone-acetaminophen (NORCO/VICODIN) 5-325 MG per tablet 1-2 tablet  1-2 tablet Oral Q4H PRN Demetrios Loll, MD      . HYDROmorphone (DILAUDID) injection 1 mg  1 mg Intravenous Q4H PRN Demetrios Loll, MD      . ondansetron [facility]) tablet 4 mg  4 mg Oral Q6H PRN Demetrios Loll, MD       Or  . ondansetron [facility]) injection 4 mg  4 mg Intravenous Q6H PRN Demetrios Loll, MD      . oxybutynin (DITROPAN) tablet 5 mg  5 mg Oral Q8H PRN Stoioff, Ronda Fairly, MD      . senna-docusate (Senokot-S) tablet 1 tablet  1 tablet Oral QHS PRN Demetrios Loll, MD      . tamsulosin [facility]) capsule 0.4 mg  0.4 mg Oral Daily Stoioff, Scott C, MD   0.4 mg at 07/02/19 0857     Objective: Vital signs in last 24 hours: Temp:  [97.5 F (36.4 C)-99.6 F (37.6 C)] 98.9 F (37.2 C) (10/14 0542) Pulse Rate:  [66-96] 96 (10/14 0542) Resp:  [12-20] 16 (10/14 0542) BP: (118-177)/(60-97) 149/84 (10/14 0542) SpO2:  [91 %-100 %] 93 % (  10/14 0542)  Intake/Output from previous day: 10/13 0701 - 10/14 0700 In: 2800 [I.V.:1800; IV Piggyback:1000] Out: 2852 [Urine:2850; Blood:2] Intake/Output this shift: Total I/O In: 0  Out: 400 [Urine:400]  Physical Exam Vitals signs and nursing note reviewed.  Constitutional:      General: She is not in acute distress.    Appearance: She is not ill-appearing, toxic-appearing or diaphoretic.  HENT:     Head: Normocephalic and atraumatic.  Pulmonary:     Effort: Pulmonary effort is normal. No respiratory distress.  Skin:    General: Skin is warm and dry.  Neurological:     Mental Status: She is alert and oriented to person, place, and time.  Psychiatric:        Mood and Affect: Mood normal.        Behavior: Behavior normal.    Lab Results:  Recent Labs    07/01/19 0749 07/02/19 0620  WBC 16.4*  16.0*  HGB 15.1* 14.9  HCT 44.8 43.5  PLT 322 361   BMET Recent Labs    07/01/19 0940 07/02/19 0620  NA 141 142  K 4.1 4.0  CL 109 108  CO2 23 24  GLUCOSE 121* 125*  BUN 13 9  CREATININE 0.87 0.76  CALCIUM 11.8* 12.1*   Assessment & Plan: Patient stable POD1 ureteroscopy with basket extraction of a distal left ureteral stone with stent placement.  Proximal left ureteral stone remains in place with concerns for urinary tract infection.  Recommend continued broad-spectrum antibiotics, to narrow per urine culture results.  Plan for outpatient stent removal and ESWL.  I counseled the patient that stents can cause pain in the flank or groin and/or gross hematuria. I informed her that she may expect to experience these symptoms for the duration of the stent being in place. I counseled her that she should follow up with [place] urgently if she develops new fever, chills, nausea, or vomiting before it is removed, as these are not typical symptoms associated with a stent. She expressed understanding of this plan.  Carman Ching, PA-C 07/02/2019

## 2019-07-02 NOTE — Telephone Encounter (Signed)
Michelle scheduled pt, the floor called.

## 2019-07-02 NOTE — Plan of Care (Signed)
Pt is A/Ox4, denies pain related to her procedure, c/o of migraines this shift, Pt indicated taking Zomig MD on called was paged  new orders in place, Pt report relief, PO intake and UOP adequate for the shift, safety measures intact, will continue to monitor.  Problem: Education: Goal: Knowledge of General Education information will improve Description: Including pain rating scale, medication(s)/side effects and non-pharmacologic comfort measures Outcome: Progressing   Problem: Activity: Goal: Risk for activity intolerance will decrease Outcome: Progressing   Problem: Nutrition: Goal: Adequate nutrition will be maintained Outcome: Progressing   Problem: Elimination: Goal: Will not experience complications related to bowel motility Outcome: Progressing Goal: Will not experience complications related to urinary retention Outcome: Progressing   Problem: Pain Managment: Goal: General experience of comfort will improve Outcome: Progressing   Problem: Pain Managment: Goal: General experience of comfort will improve Outcome: Progressing

## 2019-07-02 NOTE — Progress Notes (Signed)
Patient also complained of increased frequency and urgency so we placed an external catheter on for comfort.  Will continue to monitor.  Christene Slates 07/02/2019  6:15 AM

## 2019-07-02 NOTE — Progress Notes (Signed)
Anne Haynes to be D/C'd Home per MD order.  Discussed prescriptions and follow up appointments with the patient. Prescriptions given to patient, medication list explained in detail. Pt verbalized understanding.  Allergies as of 07/02/2019   No Known Allergies     Medication List    TAKE these medications   acetaminophen 650 MG CR tablet Commonly known as: TYLENOL Take 650 mg by mouth every 8 (eight) hours as needed for pain.   ALPRAZolam 0.5 MG tablet Commonly known as: XANAX Take 0.5 mg by mouth 3 (three) times daily as needed.   cephALEXin 250 MG capsule Commonly known as: KEFLEX Take 1 capsule (250 mg total) by mouth 2 (two) times daily for 10 days.   dextroamphetamine 15 MG 24 hr capsule Commonly known as: DEXEDRINE SPANSULE Take 30 mg by mouth 2 (two) times daily.   ibuprofen 600 MG tablet Commonly known as: ADVIL Take 1 tablet (600 mg total) by mouth every 6 (six) hours as needed for moderate pain.   ondansetron 4 MG disintegrating tablet Commonly known as: Zofran ODT Take 1 tablet (4 mg total) by mouth every 8 (eight) hours as needed for nausea or vomiting.   oxybutynin 5 MG tablet Commonly known as: DITROPAN Take 1 tablet (5 mg total) by mouth every 8 (eight) hours as needed (Bladder spasms, urinary frequency, urgency).   oxyCODONE-acetaminophen 5-325 MG tablet Commonly known as: Percocet Take 1-2 tablets by mouth every 6 (six) hours as needed for moderate pain or severe pain.   tamsulosin 0.4 MG Caps capsule Commonly known as: FLOMAX Take 1 capsule (0.4 mg total) by mouth daily. Start taking on: July 03, 2019       Vitals:   07/02/19 0542 07/02/19 1140  BP: (!) 149/84 (!) 144/70  Pulse: 96   Resp: 16   Temp: 98.9 F (37.2 C) 98 F (36.7 C)  SpO2: 93%     Skin clean, dry and intact without evidence of skin break down, no evidence of skin tears noted. IV catheter discontinued intact. Site without signs and symptoms of complications. Dressing and  pressure applied. Pt denies pain at this time. No complaints noted.  An After Visit Summary was printed and given to the patient. Patient escorted via [place], and D/C home via private auto.  Fuller Mandril, RN

## 2019-07-03 LAB — URINE CULTURE: Culture: NO GROWTH

## 2019-07-03 NOTE — Discharge Summary (Signed)
[employer] [facility] [place] at [facility]   PATIENT NAME: Anne Haynes    MR#:  [id number]  DATE OF BIRTH:  Jun 25, 1966  DATE OF ADMISSION:  07/01/2019   ADMITTING PHYSICIAN: Shaune Pollack, MD  DATE OF DISCHARGE: 07/02/2019  5:11 PM  PRIMARY CARE PHYSICIAN: Patient, No Pcp Per   ADMISSION DIAGNOSIS:  Ureteral stone [N20.1] DISCHARGE DIAGNOSIS:  Active Problems:   Hydronephrosis, left  SECONDARY DIAGNOSIS:  History reviewed. No pertinent past medical history. HOSPITAL COURSE:  Anne Haynes is a 53 y.o. female admitted with left renal colic who failed outpatient management.  She had a 4 mm UVJ calculus and an 8 mm UPJ calculus. admitted for pain control.  s/p cystoscopy and left ureteroscopy with basket extraction of a 4 mm distal left ureteral calculus, left ureteral stent placement, and left retrograde pyelography with interpretation with Dr. Lonna Cobb.  She still has an 8 mm proximal left ureteral stone in place.    Sent home on Abx - urine c/s with no growth  Patient is afebrile at D/C. Creatinine 0.76.  WBC count 16.0 at D/C.  She reports significant improvement in her pain from 2 days ago.  However, she does note some left-sided flank discomfort that she associates with the stent.  She is spontaneously voiding, however she does report some urinary incontinence at baseline.  She is requested to f/up with Urology. She requests no Narcotics as she doesn't tolerate it too well. DISCHARGE CONDITIONS:  stable CONSULTS OBTAINED:  Treatment Team:  Riki Altes, MD DRUG ALLERGIES:  No Known Allergies DISCHARGE MEDICATIONS:   Allergies as of 07/02/2019   No Known Allergies     Medication List    TAKE these medications   acetaminophen 650 MG CR tablet Commonly known as: TYLENOL Take 650 mg by mouth every 8 (eight) hours as needed for pain.   ALPRAZolam 0.5 MG tablet Commonly known as: XANAX Take 0.5 mg by mouth 3 (three) times daily as needed.    cephALEXin 250 MG capsule Commonly known as: KEFLEX Take 1 capsule (250 mg total) by mouth 2 (two) times daily for 10 days.   dextroamphetamine 15 MG 24 hr capsule Commonly known as: DEXEDRINE SPANSULE Take 30 mg by mouth 2 (two) times daily.   ibuprofen 600 MG tablet Commonly known as: ADVIL Take 1 tablet (600 mg total) by mouth every 6 (six) hours as needed for moderate pain.   ondansetron 4 MG disintegrating tablet Commonly known as: Zofran ODT Take 1 tablet (4 mg total) by mouth every 8 (eight) hours as needed for nausea or vomiting.   oxybutynin 5 MG tablet Commonly known as: DITROPAN Take 1 tablet (5 mg total) by mouth every 8 (eight) hours as needed (Bladder spasms, urinary frequency, urgency).   oxyCODONE-acetaminophen 5-325 MG tablet Commonly known as: Percocet Take 1-2 tablets by mouth every 6 (six) hours as needed for moderate pain or severe pain.   tamsulosin 0.4 MG Caps capsule Commonly known as: FLOMAX Take 1 capsule (0.4 mg total) by mouth daily.        DISCHARGE INSTRUCTIONS:   DIET:  Regular diet DISCHARGE CONDITION:  Good ACTIVITY:  Activity as tolerated OXYGEN:  Home Oxygen: No.  Oxygen Delivery: room air DISCHARGE LOCATION:  home   If you experience worsening of your admission symptoms, develop shortness of breath, life threatening emergency, suicidal or homicidal thoughts you must seek medical attention immediately by calling 911 or calling your MD immediately  if symptoms less severe.  You Must read complete instructions/literature along with all the possible adverse reactions/side effects for all the Medicines you take and that have been prescribed to you. Take any new Medicines after you have completely understood and accpet all the possible adverse reactions/side effects.   Please note  You were cared for by a hospitalist during your hospital stay. If you have any questions about your discharge medications or the care you received while  you were in the hospital after you are discharged, you can call the unit and asked to speak with the hospitalist on call if the hospitalist that took care of you is not available. Once you are discharged, your primary care physician will handle any further medical issues. Please note that NO REFILLS for any discharge medications will be authorized once you are discharged, as it is imperative that you return to your primary care physician (or establish a relationship with a primary care physician if you do not have one) for your aftercare needs so that they can reassess your need for medications and monitor your lab values.    On the day of Discharge:  VITAL SIGNS:  Blood pressure (!) 144/70, pulse 96, temperature 98 F (36.7 C), temperature source Oral, resp. rate 16, height 5\' 8"  (1.727 m), weight 77.1 kg, SpO2 93 %. PHYSICAL EXAMINATION:  GENERAL:  53 y.o.-year-old patient lying in the bed with no acute distress.  EYES: Pupils equal, round, reactive to light and accommodation. No scleral icterus. Extraocular muscles intact.  HEENT: Head atraumatic, normocephalic. Oropharynx and nasopharynx clear.  NECK:  Supple, no jugular venous distention. No thyroid enlargement, no tenderness.  LUNGS: Normal breath sounds bilaterally, no wheezing, rales,rhonchi or crepitation. No use of accessory muscles of respiration.  CARDIOVASCULAR: S1, S2 normal. No murmurs, rubs, or gallops.  ABDOMEN: Soft, non-tender, non-distended. Bowel sounds present. No organomegaly or mass.  EXTREMITIES: No pedal edema, cyanosis, or clubbing.  NEUROLOGIC: Cranial nerves II through XII are intact. Muscle strength 5/5 in all extremities. Sensation intact. Gait not checked.  PSYCHIATRIC: The patient is alert and oriented x 3.  SKIN: No obvious rash, lesion, or ulcer.  DATA REVIEW:   CBC Recent Labs  Lab 07/02/19 0620  WBC 16.0*  HGB 14.9  HCT 43.5  PLT 361    Chemistries  Recent Labs  Lab 06/30/19 0841  07/02/19 0620   NA 138   < > 142  K 3.7   < > 4.0  CL 106   < > 108  CO2 28   < > 24  GLUCOSE 107*   < > 125*  BUN 16   < > 9  CREATININE 0.73   < > 0.76  CALCIUM 11.8*   < > 12.1*  AST 18  --   --   ALT 19  --   --   ALKPHOS 78  --   --   BILITOT 0.8  --   --    < > = values in this interval not displayed.     Microbiology Results  Results for orders placed or performed during the hospital encounter of 07/01/19  SARS Coronavirus 2 by RT PCR (hospital order, performed in [facility] hospital lab) Nasopharyngeal Nasopharyngeal Swab     Status: None   Collection Time: 07/01/19 11:44 AM   Specimen: Nasopharyngeal Swab  Result Value Ref Range Status   SARS Coronavirus 2 NEGATIVE NEGATIVE Final    Comment: (NOTE) If result is NEGATIVE SARS-CoV-2 target nucleic acids are NOT DETECTED. The  SARS-CoV-2 RNA is generally detectable in upper and lower  respiratory specimens during the acute phase of infection. The lowest  concentration of SARS-CoV-2 viral copies this assay can detect is 250  copies / mL. A negative result does not preclude SARS-CoV-2 infection  and should not be used as the sole basis for treatment or other  patient management decisions.  A negative result may occur with  improper specimen collection / handling, submission of specimen other  than nasopharyngeal swab, presence of viral mutation(s) within the  areas targeted by this assay, and inadequate number of viral copies  (<250 copies / mL). A negative result must be combined with clinical  observations, patient history, and epidemiological information. If result is POSITIVE SARS-CoV-2 target nucleic acids are DETECTED. The SARS-CoV-2 RNA is generally detectable in upper and lower  respiratory specimens dur ing the acute phase of infection.  Positive  results are indicative of active infection with SARS-CoV-2.  Clinical  correlation with patient history and other diagnostic information is  necessary to determine patient  infection status.  Positive results do  not rule out bacterial infection or co-infection with other viruses. If result is PRESUMPTIVE POSTIVE SARS-CoV-2 nucleic acids MAY BE PRESENT.   A presumptive positive result was obtained on the submitted specimen  and confirmed on repeat testing.  While 2019 novel coronavirus  (SARS-CoV-2) nucleic acids may be present in the submitted sample  additional confirmatory testing may be necessary for epidemiological  and / or clinical management purposes  to differentiate between  SARS-CoV-2 and other Sarbecovirus currently known to infect humans.  If clinically indicated additional testing with an alternate test  methodology [phone] is advised. The SARS-CoV-2 RNA is generally  detectable in upper and lower respiratory sp ecimens during the acute  phase of infection. The expected result is Negative. Fact Sheet for Patients:  BoilerBrush.com.cyhttps://www.fda.gov/media/136312/download Fact Sheet for Healthcare Providers: https://pope.com/https://www.fda.gov/media/136313/download This test is not yet approved or cleared by the [place] FDA and has been authorized for detection and/or diagnosis of SARS-CoV-2 by FDA under an Emergency Use Authorization (EUA).  This EUA will remain in effect (meaning this test can be used) for the duration of the COVID-19 declaration under Section 564(b)(1) of the Act, 21 U.S.C. section 360bbb-3(b)(1), unless the authorization is terminated or revoked sooner. Performed at [facility], [address] [place], [place] [address]   Urine Culture     Status: None   Collection Time: 07/01/19  3:37 PM   Specimen: PATH Other; Urine  Result Value Ref Range Status   Specimen Description URINE, CATHETERIZED  Final   Special Requests LEFT RENAL PELVIS  Final   Culture   Final    NO GROWTH Performed at [facility], [address] [place], [place] [address]    Report Status 07/03/2019 FINAL  Final    RADIOLOGY:  No results  found.   Management plans discussed with the patient, family and they are in agreement.  CODE STATUS: Prior   TOTAL TIME TAKING CARE OF THIS PATIENT: 45 minutes.    Delfino LovettVipul Curren Mohrmann M.D on 07/03/2019 at 8:50 PM  Between 7am to 6pm - Pager - [phone]  After 6pm go to [email or web address] - Social research officer, governmentpassword [email or web address] [email or web address]  [facility] [place] [facility]  Office  [phone]  CC: Primary care physician; Patient, No Pcp Per   Note: This dictation was prepared with Dragon dictation along with smaller phrase technology. Any transcriptional errors that result from this process are unintentional.

## 2019-07-04 ENCOUNTER — Telehealth: Payer: Self-pay | Admitting: Urology

## 2019-07-04 NOTE — Telephone Encounter (Signed)
Patient has not has a bowel movement since her procedure on 07-01-19 and wants to know if there is anything she can take to help this along and if this is normal Can someone give her a call she has some questions I think I answered most of them    Anne Haynes

## 2019-07-04 NOTE — Telephone Encounter (Signed)
Called pt no answer. Unable to leave message as no voicemail is set up. 1st attempt.  

## 2019-07-07 ENCOUNTER — Encounter: Payer: Self-pay | Admitting: Urology

## 2019-07-07 ENCOUNTER — Ambulatory Visit (INDEPENDENT_AMBULATORY_CARE_PROVIDER_SITE_OTHER): Admitting: Urology

## 2019-07-07 ENCOUNTER — Other Ambulatory Visit: Payer: Self-pay

## 2019-07-07 VITALS — BP 126/75 | HR 96 | Ht 68.5 in | Wt 174.0 lb

## 2019-07-07 DIAGNOSIS — N920 Excessive and frequent menstruation with regular cycle: Secondary | ICD-10-CM | POA: Insufficient documentation

## 2019-07-07 DIAGNOSIS — N840 Polyp of corpus uteri: Secondary | ICD-10-CM | POA: Insufficient documentation

## 2019-07-07 DIAGNOSIS — N9489 Other specified conditions associated with female genital organs and menstrual cycle: Secondary | ICD-10-CM | POA: Insufficient documentation

## 2019-07-07 DIAGNOSIS — N200 Calculus of kidney: Secondary | ICD-10-CM | POA: Diagnosis not present

## 2019-07-07 NOTE — Telephone Encounter (Signed)
Pt to be seen in clinic 07/07/2019 @3 :15pm

## 2019-07-08 ENCOUNTER — Other Ambulatory Visit
Admission: RE | Admit: 2019-07-08 | Discharge: 2019-07-08 | Disposition: A | Discharge status: Home / Self Care | Source: Ambulatory Visit | Attending: Urology | Admitting: Urology

## 2019-07-08 ENCOUNTER — Encounter: Payer: Self-pay | Admitting: Urology

## 2019-07-08 ENCOUNTER — Other Ambulatory Visit: Payer: Self-pay | Admitting: Radiology

## 2019-07-08 DIAGNOSIS — N200 Calculus of kidney: Secondary | ICD-10-CM

## 2019-07-08 DIAGNOSIS — Z20828 Contact with and (suspected) exposure to other viral communicable diseases: Secondary | ICD-10-CM | POA: Diagnosis not present

## 2019-07-08 DIAGNOSIS — Z01812 Encounter for preprocedural laboratory examination: Secondary | ICD-10-CM | POA: Diagnosis present

## 2019-07-08 LAB — CALCULI, WITH PHOTOGRAPH (CLINICAL LAB)
Calcium Oxalate Dihydrate: 80 %
Hydroxyapatite: 20 %
Weight Calculi: 2 mg

## 2019-07-08 LAB — SARS CORONAVIRUS 2 (TAT 6-24 HRS): SARS Coronavirus 2: NEGATIVE

## 2019-07-08 NOTE — Progress Notes (Signed)
07/07/2019 11:01 AM   Anne Haynes 04/04/1966 [id number]  Referring provider: No referring provider defined for this encounter.  Chief Complaint  Patient presents with  . Nephrolithiasis    HPI: 53 y.o. female presents for recent hospitalization follow-up.  She presented to the ED on 07/01/2019 with left renal colic.  She had a 4 mm left distal ureteral calculus with mild hydroureter and an 8 mm left UPJ calculus with moderate hydronephrosis.  She did have leukocytosis with a left shift.  She underwent left ureteroscopic stone removal of her distal calculus and left ureteral stent placement.  Urine from the left renal pelvis was collected and sent for culture.  Her pain resolved with stent placement.  She has mild stent irritative symptoms.  Her intraoperative urine culture was negative.  The calculus was visualized on fluoroscopy.  She presents today to discuss definitive stone treatment.   PMH: Past Medical History:  Diagnosis Date  . Kidney stone     Surgical History: Past Surgical History:  Procedure Laterality Date  . BACK SURGERY    . CYSTOSCOPY WITH STENT PLACEMENT Left 07/01/2019   Procedure: CYSTOSCOPY WITH STENT PLACEMENT;  Surgeon: Riki Altes, MD;  Location: [facility] ORS;  Service: Urology;  Laterality: Left;  . STONE EXTRACTION WITH BASKET Left 07/01/2019   Procedure: STONE EXTRACTION WITH BASKET;  Surgeon: Riki Altes, MD;  Location: [facility] ORS;  Service: Urology;  Laterality: Left;  . URETEROSCOPY WITH HOLMIUM LASER LITHOTRIPSY Left 07/01/2019   Procedure: URETEROSCOPY WITH HOLMIUM LASER LITHOTRIPSY;  Surgeon: Riki Altes, MD;  Location: [facility] ORS;  Service: Urology;  Laterality: Left;    Home Medications:  Allergies as of 07/07/2019   No Known Allergies     Medication List       Accurate as of July 07, 2019 11:59 PM. If you have any questions, ask your nurse or doctor.        acetaminophen 650 MG CR tablet Commonly known as: TYLENOL Take  650 mg by mouth every 8 (eight) hours as needed for pain.   ALPRAZolam 0.5 MG tablet Commonly known as: XANAX Take 0.5 mg by mouth 3 (three) times daily as needed.   cephALEXin 250 MG capsule Commonly known as: KEFLEX Take 1 capsule (250 mg total) by mouth 2 (two) times daily for 10 days.   dextroamphetamine 15 MG 24 hr capsule Commonly known as: DEXEDRINE SPANSULE Take 30 mg by mouth 2 (two) times daily.   ibuprofen 600 MG tablet Commonly known as: ADVIL Take 1 tablet (600 mg total) by mouth every 6 (six) hours as needed for moderate pain.   ondansetron 4 MG disintegrating tablet Commonly known as: Zofran ODT Take 1 tablet (4 mg total) by mouth every 8 (eight) hours as needed for nausea or vomiting.   oxybutynin 5 MG tablet Commonly known as: DITROPAN Take 1 tablet (5 mg total) by mouth every 8 (eight) hours as needed (Bladder spasms, urinary frequency, urgency).   oxyCODONE-acetaminophen 5-325 MG tablet Commonly known as: Percocet Take 1-2 tablets by mouth every 6 (six) hours as needed for moderate pain or severe pain.   tamsulosin 0.4 MG Caps capsule Commonly known as: FLOMAX Take 1 capsule (0.4 mg total) by mouth daily.       Allergies: No Known Allergies  Family History: No family history on file.  Social History:  reports that she has quit smoking. She has quit using smokeless tobacco. She reports current alcohol use. She reports previous drug use.  ROS: UROLOGY Frequent Urination?: No Hard to postpone urination?: No Burning/pain with urination?: No Get up at night to urinate?: No Leakage of urine?: No Urine stream starts and stops?: No Trouble starting stream?: No Do you have to strain to urinate?: No Blood in urine?: No Urinary tract infection?: No Sexually transmitted disease?: No Injury to kidneys or bladder?: No Painful intercourse?: No Weak stream?: No Currently pregnant?: No Vaginal bleeding?: No Last menstrual period?: N/A   Gastrointestinal Nausea?: No Vomiting?: No Indigestion/heartburn?: No Diarrhea?: No Constipation?: No  Constitutional Fever: No Night sweats?: No Weight loss?: No Fatigue?: No  Skin Skin rash/lesions?: No Itching?: No  Eyes Blurred vision?: No Double vision?: No  Ears/Nose/Throat Sore throat?: No Sinus problems?: No  Hematologic/Lymphatic Swollen glands?: No Easy bruising?: No  Cardiovascular Leg swelling?: No Chest pain?: No  Respiratory Cough?: No Shortness of breath?: No  Endocrine Excessive thirst?: No  Musculoskeletal Back pain?: No Joint pain?: No  Neurological Headaches?: No Dizziness?: No  Psychologic Depression?: No Anxiety?: No  Physical Exam: BP 126/75 (BP Location: Left Arm, Patient Position: Sitting, Cuff Size: Normal)   Pulse 96   Ht 5' 8.5" (1.74 m)   Wt 174 lb (78.9 kg)   BMI 26.07 kg/m   Constitutional:  Alert and oriented, No acute distress. HEENT: [place] AT, moist mucus membranes.  Trachea midline, no masses. Cardiovascular: No clubbing, cyanosis, or edema.  RRR Respiratory: Normal respiratory effort, no increased work of breathing.  Clear GI: Abdomen is soft, nontender, nondistended, no abdominal masses GU: No CVA tenderness Lymph: No cervical or inguinal lymphadenopathy. Skin: No rashes, bruises or suspicious lesions. Neurologic: Grossly intact, no focal deficits, moving all 4 extremities. Psychiatric: Normal mood and affect.   Assessment & Plan:    - Left nephrolithiasis We discussed options for definitive stone treatment including ureteroscopic removal and shockwave lithotripsy.  The pros and cons of each treatment were discussed in detail.  She has elected the minimal invasive option of shockwave lithotripsy.  The indications and nature of the planned procedure were discussed as well as the potential benefits and expected outcome.  Alternatives were discussed as described above.  The most common complications and side  effects were discussed as outlined in the [facility] consent form.  It was stressed that there is no guarantee that lithotripsy will be successful and she could require retreatment or alternative treatment.  The rare instance of perirenal bleeding requiring hospitalization, transfusion and rarely surgery were discussed.  The possibility of renal colic from obstructing stone fragments requiring stent placement or ureteroscopy was also discussed.    Abbie Sons, [place] [address] [place], [place] [address] [phone]

## 2019-07-09 ENCOUNTER — Telehealth: Payer: Self-pay | Admitting: Urology

## 2019-07-09 NOTE — Telephone Encounter (Signed)
Pt called and states that she is having Left side weakness (her arm and leg), she also states that she had a flu shot last week. I advised her to call her PCP and she states that she does not have one at this time. She is scheduled for surgery on 07/10/2019.

## 2019-07-09 NOTE — Telephone Encounter (Signed)
Called pt strongly recommended that patient seek care in the ED immediately. Pt gave verbal understanding.

## 2019-07-10 ENCOUNTER — Other Ambulatory Visit: Payer: Self-pay

## 2019-07-10 ENCOUNTER — Encounter: Payer: Self-pay | Admitting: *Deleted

## 2019-07-10 ENCOUNTER — Encounter
Admission: RE | Disposition: A | Discharge status: Home / Self Care | Payer: Self-pay | Source: Home / Self Care | Attending: Urology

## 2019-07-10 ENCOUNTER — Ambulatory Visit

## 2019-07-10 ENCOUNTER — Ambulatory Visit
Admission: RE | Admit: 2019-07-10 | Discharge: 2019-07-10 | Disposition: A | Discharge status: Home / Self Care | Attending: Urology | Admitting: Urology

## 2019-07-10 DIAGNOSIS — N200 Calculus of kidney: Secondary | ICD-10-CM | POA: Diagnosis present

## 2019-07-10 HISTORY — PX: EXTRACORPOREAL SHOCK WAVE LITHOTRIPSY: SHX1557

## 2019-07-10 SURGERY — LITHOTRIPSY, ESWL
Anesthesia: Moderate Sedation | Laterality: Left

## 2019-07-10 MED ORDER — DIPHENHYDRAMINE HCL 25 MG PO CAPS
ORAL_CAPSULE | ORAL | Status: AC
  Filled 2019-07-10: qty 1

## 2019-07-10 MED ORDER — CIPROFLOXACIN HCL 500 MG PO TABS
500.0000 mg | ORAL_TABLET | ORAL | Status: AC
  Administered 2019-07-10: 09:00:00 500 mg via ORAL

## 2019-07-10 MED ORDER — DIAZEPAM 5 MG PO TABS
ORAL_TABLET | ORAL | Status: AC
  Filled 2019-07-10: qty 2

## 2019-07-10 MED ORDER — ONDANSETRON HCL 4 MG/2ML IJ SOLN
INTRAMUSCULAR | Status: AC
  Administered 2019-07-10: 4 mg via INTRAVENOUS
  Filled 2019-07-10: qty 2

## 2019-07-10 MED ORDER — ONDANSETRON HCL 4 MG/2ML IJ SOLN
4.0000 mg | Freq: Once | INTRAMUSCULAR | Status: AC | PRN
  Administered 2019-07-10: 09:00:00 4 mg via INTRAVENOUS

## 2019-07-10 MED ORDER — DIPHENHYDRAMINE HCL 25 MG PO CAPS
25.0000 mg | ORAL_CAPSULE | ORAL | Status: AC
  Administered 2019-07-10: 09:00:00 25 mg via ORAL

## 2019-07-10 MED ORDER — SODIUM CHLORIDE 0.9 % IV SOLN
INTRAVENOUS | Status: DC
  Administered 2019-07-10: 09:00:00 via INTRAVENOUS

## 2019-07-10 MED ORDER — CIPROFLOXACIN HCL 500 MG PO TABS
ORAL_TABLET | ORAL | Status: AC
  Filled 2019-07-10: qty 1

## 2019-07-10 MED ORDER — DIAZEPAM 5 MG PO TABS
10.0000 mg | ORAL_TABLET | ORAL | Status: AC
  Administered 2019-07-10: 09:00:00 10 mg via ORAL

## 2019-07-10 NOTE — Discharge Instructions (Addendum)
As per [facility] discharge instructions.  AMBULATORY SURGERY  DISCHARGE INSTRUCTIONS   1) The drugs that you were given will stay in your system until tomorrow so for the next 24 hours you should not:  A) Drive an automobile B) Make any legal decisions C) Drink any alcoholic beverage   2) You may resume regular meals tomorrow.  Today it is better to start with liquids and gradually work up to solid foods.  You may eat anything you prefer, but it is better to start with liquids, then soup and crackers, and gradually work up to solid foods.   3) Please notify your doctor immediately if you have any unusual bleeding, trouble breathing, redness and pain at the surgery site, drainage, fever, or pain not relieved by medication.    4) Additional Instructions:        Please contact your physician with any problems or Same Day Surgery at [phone], Monday through Friday 6 am to 4 pm, or [place] at [facility] number at [phone].

## 2019-07-14 ENCOUNTER — Telehealth: Payer: Self-pay | Admitting: Urology

## 2019-07-14 NOTE — Telephone Encounter (Signed)
Called pt. Have her come in the morning for KUB. No answer. Will call back first thing in the morning.

## 2019-07-15 ENCOUNTER — Ambulatory Visit
Admission: RE | Admit: 2019-07-15 | Discharge: 2019-07-15 | Disposition: A | Discharge status: Home / Self Care | Attending: Urology | Admitting: Urology

## 2019-07-15 ENCOUNTER — Ambulatory Visit
Admission: RE | Admit: 2019-07-15 | Discharge: 2019-07-15 | Disposition: A | Discharge status: Home / Self Care | Source: Ambulatory Visit | Attending: Urology | Admitting: Urology

## 2019-07-15 ENCOUNTER — Other Ambulatory Visit: Payer: Self-pay

## 2019-07-15 ENCOUNTER — Other Ambulatory Visit: Payer: Self-pay | Admitting: Urology

## 2019-07-15 DIAGNOSIS — N200 Calculus of kidney: Secondary | ICD-10-CM | POA: Insufficient documentation

## 2019-07-15 NOTE — Telephone Encounter (Signed)
Called pt. To come in today for KUB. No answer No voicemail set up.

## 2019-07-16 ENCOUNTER — Ambulatory Visit (INDEPENDENT_AMBULATORY_CARE_PROVIDER_SITE_OTHER): Admitting: Physician Assistant

## 2019-07-16 ENCOUNTER — Encounter: Payer: Self-pay | Admitting: Physician Assistant

## 2019-07-16 VITALS — BP 123/73 | HR 80 | Ht 68.0 in | Wt 174.0 lb

## 2019-07-16 DIAGNOSIS — R300 Dysuria: Secondary | ICD-10-CM

## 2019-07-16 DIAGNOSIS — M79602 Pain in left arm: Secondary | ICD-10-CM | POA: Diagnosis not present

## 2019-07-16 DIAGNOSIS — N3289 Other specified disorders of bladder: Secondary | ICD-10-CM

## 2019-07-16 MED ORDER — OXYBUTYNIN CHLORIDE 5 MG PO TABS: 5.0000 mg | ORAL_TABLET | Freq: Three times a day (TID) | ORAL | 0 refills | Status: DC | PRN

## 2019-07-16 NOTE — Telephone Encounter (Signed)
KUB reviewed and the stone is well fragmented.  Can have her come in at 0800 this Friday for cystoscopy/stent removal.

## 2019-07-16 NOTE — Progress Notes (Signed)
07/16/2019 2:17 PM   Edrick Oh 03-Aug-1966 [id number]  CC: Dysuria, gross hematuria; left arm pain  HPI: Anne Haynes is a 53 y.o. female who presents today for evaluation of possible UTI.  She recently completed stage treatment for 2 left ureteral calculi.  She underwent cystoscopy with left ureteroscopy and basket extraction of 4 mm distal ureteral calculus with left ureteral stent placement with Dr. Bernardo Heater on 07/01/2019 followed by ESWL of 8 mm left UPJ calculus on 07/10/2019.  She is scheduled for left ureteral stent removal in clinic in 2 days.  Today she reports dysuria, left flank pain, and gross hematuria in the setting of her ureteral stent.  The dysuria occurs with termination of urination and has acutely worsened over the past 2 days.  She describes the pain as intermittent in nature.  She has taken oxybutynin for this with adequate symptom palliation, but she has been avoiding taking this consistently due to safety concerns. She only has 3 tablets left of this medication.  In-office UA today positive for 3+ blood, 3+ protein, and trace leukocyte esterase; urine microscopy with 6-10 WBCs/HPF, >30 RBCs/HPF, and calcium oxalate crystals.   Additionally, patient also reports shooting pain of the left upper extremity following flu vaccination during her recent hospitalization with obstructive uropathy.  She states the pain is becoming more frequent and she is noticing it in her right leg as well.  She denies numbness, tingling, weakness, falls, increase in severity, or difficulty breathing.  She does not have a PCP to evaluate this, however has her first appointment with a PCP to establish care next week.  She wonders if she should seek care for this at urgent care in the meantime.  PMH: Past Medical History:  Diagnosis Date  . Kidney stone     Surgical History: Past Surgical History:  Procedure Laterality Date  . BACK SURGERY    . CYSTOSCOPY WITH STENT PLACEMENT Left  07/01/2019   Procedure: CYSTOSCOPY WITH STENT PLACEMENT;  Surgeon: Abbie Sons, MD;  Location: [facility];  Service: Urology;  Laterality: Left;  . EXTRACORPOREAL SHOCK WAVE LITHOTRIPSY Left 07/10/2019   Procedure: EXTRACORPOREAL SHOCK WAVE LITHOTRIPSY (ESWL);  Surgeon: Abbie Sons, MD;  Location: [facility];  Service: Urology;  Laterality: Left;  . STONE EXTRACTION WITH BASKET Left 07/01/2019   Procedure: STONE EXTRACTION WITH BASKET;  Surgeon: Abbie Sons, MD;  Location: [facility];  Service: Urology;  Laterality: Left;  . URETEROSCOPY WITH HOLMIUM LASER LITHOTRIPSY Left 07/01/2019   Procedure: URETEROSCOPY WITH HOLMIUM LASER LITHOTRIPSY;  Surgeon: Abbie Sons, MD;  Location: [facility];  Service: Urology;  Laterality: Left;    Home Medications:  Allergies as of 07/16/2019   No Known Allergies     Medication List       Accurate as of July 16, 2019 11:59 PM. If you have any questions, ask your nurse or doctor.        acetaminophen 650 MG CR tablet Commonly known as: TYLENOL Take 650 mg by mouth every 8 (eight) hours as needed for pain.   ALPRAZolam 0.5 MG tablet Commonly known as: XANAX Take 0.5 mg by mouth 3 (three) times daily as needed.   dextroamphetamine 15 MG 24 hr capsule Commonly known as: DEXEDRINE SPANSULE Take 30 mg by mouth 2 (two) times daily.   ibuprofen 600 MG tablet Commonly known as: ADVIL Take 1 tablet (600 mg total) by mouth every 6 (six) hours as needed for moderate pain.   ondansetron 4  MG disintegrating tablet Commonly known as: Zofran ODT Take 1 tablet (4 mg total) by mouth every 8 (eight) hours as needed for nausea or vomiting.   oxybutynin 5 MG tablet Commonly known as: DITROPAN Take 1 tablet (5 mg total) by mouth every 8 (eight) hours as needed (Bladder spasms, urinary frequency, urgency).   oxyCODONE-acetaminophen 5-325 MG tablet Commonly known as: Percocet Take 1-2 tablets by mouth every 6 (six) hours as needed for moderate  pain or severe pain.   tamsulosin 0.4 MG Caps capsule Commonly known as: FLOMAX Take 1 capsule (0.4 mg total) by mouth daily.       Allergies:  No Known Allergies  Family History: No family history on file.  Social History:   reports that she quit smoking about 19 years ago. She has quit using smokeless tobacco. She reports current alcohol use. She reports that she does not use drugs.  ROS: UROLOGY Frequent Urination?: No Hard to postpone urination?: No Burning/pain with urination?: No Get up at night to urinate?: No Leakage of urine?: No Urine stream starts and stops?: No Trouble starting stream?: No Do you have to strain to urinate?: No Blood in urine?: No Urinary tract infection?: No Sexually transmitted disease?: No Injury to kidneys or bladder?: No Painful intercourse?: No Weak stream?: No Currently pregnant?: No Vaginal bleeding?: No Last menstrual period?: n  Gastrointestinal Nausea?: No Vomiting?: No Indigestion/heartburn?: No Diarrhea?: No Constipation?: No  Constitutional Fever: No Night sweats?: No Weight loss?: No Fatigue?: No  Skin Skin rash/lesions?: No Itching?: No  Eyes Blurred vision?: No Double vision?: No  Ears/Nose/Throat Sore throat?: No Sinus problems?: No  Hematologic/Lymphatic Swollen glands?: No Easy bruising?: No  Cardiovascular Leg swelling?: No Chest pain?: No  Respiratory Cough?: No Shortness of breath?: No  Endocrine Excessive thirst?: No  Musculoskeletal Back pain?: No Joint pain?: No  Neurological Headaches?: No Dizziness?: No  Psychologic Depression?: No Anxiety?: No  Physical Exam: BP 123/73   Pulse 80   Ht 5\' 8"  (1.727 m)   Wt 174 lb (78.9 kg)   BMI 26.46 kg/m   Constitutional:  Alert and oriented, no acute distress, nontoxic appearing HEENT: Anne Haynes, AT Cardiovascular: No clubbing, cyanosis, or edema Respiratory: Normal respiratory effort, no increased work of breathing Skin: No rashes,  bruises or suspicious lesions Neurologic: Grossly intact, no focal deficits, moving all 4 extremities Psychiatric: Normal mood and affect  Laboratory Data: Results for orders placed or performed in visit on 07/16/19  Microscopic Examination   URINE  Result Value Ref Range   WBC, UA 6-10 (A) 0 - 5 /hpf   RBC >30 (A) 0 - 2 /hpf   Epithelial Cells (non renal) 0-10 0 - 10 /hpf   Crystals Present (A) N/A   Crystal Type Calcium Oxalate N/A   Bacteria, UA Few None seen/Few  Urinalysis, Complete  Result Value Ref Range   Specific Gravity, UA >1.030 (H) 1.005 - 1.030   pH, UA 5.0 5.0 - 7.5   Color, UA Red (A) Yellow   Appearance Ur Cloudy (A) Clear   Leukocytes,UA Trace (A) Negative   Protein,UA 3+ (A) Negative/Trace   Glucose, UA Negative Negative   Ketones, UA Negative Negative   RBC, UA 3+ (A) Negative   Bilirubin, UA Negative Negative   Urobilinogen, Ur 0.2 0.2 - 1.0 mg/dL   Nitrite, UA Negative Negative   Microscopic Examination See below:    Assessment & Plan:   1. Dysuria 53 year old female with complaints of left flank pain,  dysuria, and gross hematuria with left ureteral stent in place following staged procedure for removal of 2 left ureteral stones.  UA significant for mild pyuria and microscopic hematuria, consistent with stent.  Given relief of symptoms with oxybutynin, I do not believe these are indicative of urinary tract infection.  I advised her to take Azo for the next 2-3 days for further management of her symptoms pending stent removal in 2 days.  She expressed understanding. - CULTURE, URINE COMPREHENSIVE - Urinalysis, Complete  2. Bladder spasm Strongly suspect bladder spasm given patient's presentation and palliation with oxybutynin.  She is down to 3 tablets of this remaining.  I have prescribed a refill today.  I expect her symptoms should improve with stent removal on Friday.  I advised her to call the clinic on Monday if her symptoms persist so that she can drop  off a urine sample at that time for culture and possible treatment for UTI.  She expressed understanding. - oxybutynin (DITROPAN) 5 MG tablet; Take 1 tablet (5 mg total) by mouth every 8 (eight) hours as needed (Bladder spasms, urinary frequency, urgency).  Dispense: 6 tablet; Refill: 0  3. Pain of left upper extremity I encouraged patient to keep her upcoming PCP appointment to establish care and raise this concern with that provider.  In the interim, I advised her to proceed to the emergency department if she develops weakness, numbness, or difficulty breathing for urgent evaluation.  She expressed understanding.  Return if symptoms worsen or fail to improve.  Anne ChingSamantha Jahnasia Tatum, PA-C  [facility] [address], [address] [place], [place] [address] [phone]

## 2019-07-17 LAB — URINALYSIS, COMPLETE
Bilirubin, UA: NEGATIVE
Glucose, UA: NEGATIVE
Ketones, UA: NEGATIVE
Nitrite, UA: NEGATIVE
Specific Gravity, UA: >1.03 — ABNORMAL HIGH (ref 1.005–1.030)
Urobilinogen, Ur: 0.2 mg/dL (ref 0.2–1.0)
pH, UA: 5 (ref 5.0–7.5)

## 2019-07-17 LAB — MICROSCOPIC EXAMINATION: RBC, Urine: >30 /hpf — AB (ref 0–2)

## 2019-07-18 ENCOUNTER — Other Ambulatory Visit: Payer: Self-pay

## 2019-07-18 ENCOUNTER — Encounter: Payer: Self-pay | Admitting: Urology

## 2019-07-18 ENCOUNTER — Ambulatory Visit (INDEPENDENT_AMBULATORY_CARE_PROVIDER_SITE_OTHER): Admitting: Urology

## 2019-07-18 VITALS — BP 109/72 | HR 91 | Ht 68.0 in | Wt 174.0 lb

## 2019-07-18 DIAGNOSIS — N200 Calculus of kidney: Secondary | ICD-10-CM

## 2019-07-18 DIAGNOSIS — Z01812 Encounter for preprocedural laboratory examination: Secondary | ICD-10-CM | POA: Diagnosis not present

## 2019-07-18 MED ORDER — SULFAMETHOXAZOLE-TRIMETHOPRIM 800-160 MG PO TABS: 1.0000 | ORAL_TABLET | Freq: Two times a day (BID) | ORAL | 0 refills | Status: AC

## 2019-07-18 MED ORDER — LEVOFLOXACIN 500 MG PO TABS
500.0000 mg | ORAL_TABLET | Freq: Once | ORAL | Status: AC
  Administered 2019-07-18: 500 mg via ORAL

## 2019-07-18 NOTE — Progress Notes (Signed)
Indications: Patient is 53 y.o., female approximately 1 week status post shockwave lithotripsy of an 8 mm left UPJ calculus.  Stent was previously placed for renal colic and possible UTI although urine culture from the renal pelvis was subsequently negative.  Follow-up KUB showed excellent stone fragmentation with minute fragments in the lower pole.  She is having fairly significant stent symptoms. She was seen earlier this week by our PA.  Urine culture is pending up preliminary is growing 25,000 colonies gram-negative rods. The patient is presenting today for stent removal.  Procedure:  Flexible Cystoscopy with stent removal (65993)  Timeout was performed and the correct patient, procedure and participants were identified.    Description:  The patient was prepped and draped in the usual sterile fashion. Flexible cystosopy was performed.  The stent was visualized, grasped, and removed intact without difficulty. The patient tolerated the procedure well.  A single dose of oral antibiotics was given.  Complications:  None  Plan: Follow-up 1 month for KUB, discussion of stone analysis and metabolic evaluation.  Antibiotic Rx was sent to pharmacy pending her final culture result.

## 2019-07-19 LAB — CULTURE, URINE COMPREHENSIVE

## 2019-07-30 ENCOUNTER — Ambulatory Visit: Admitting: Urology

## 2019-08-20 ENCOUNTER — Other Ambulatory Visit: Payer: Self-pay

## 2019-08-20 ENCOUNTER — Encounter: Payer: Self-pay | Admitting: Urology

## 2019-08-20 ENCOUNTER — Ambulatory Visit
Admission: RE | Admit: 2019-08-20 | Discharge: 2019-08-20 | Disposition: A | Discharge status: Home / Self Care | Source: Ambulatory Visit | Attending: Urology | Admitting: Urology

## 2019-08-20 ENCOUNTER — Ambulatory Visit (INDEPENDENT_AMBULATORY_CARE_PROVIDER_SITE_OTHER): Admitting: Urology

## 2019-08-20 DIAGNOSIS — N200 Calculus of kidney: Secondary | ICD-10-CM | POA: Insufficient documentation

## 2019-08-20 IMAGING — CR DG ABDOMEN 1V
1 series · 2 of 2 positions shown · non-contrast
Comparison: Radiograph [DATE]

CLINICAL DATA: S/p shockwave lithotripsy.

EXAM:
ABDOMEN - 1 VIEW

[Series 1: dg abd 1 view · 0.14mm/px · 2 of 2 slices shown]
[im 1/2]
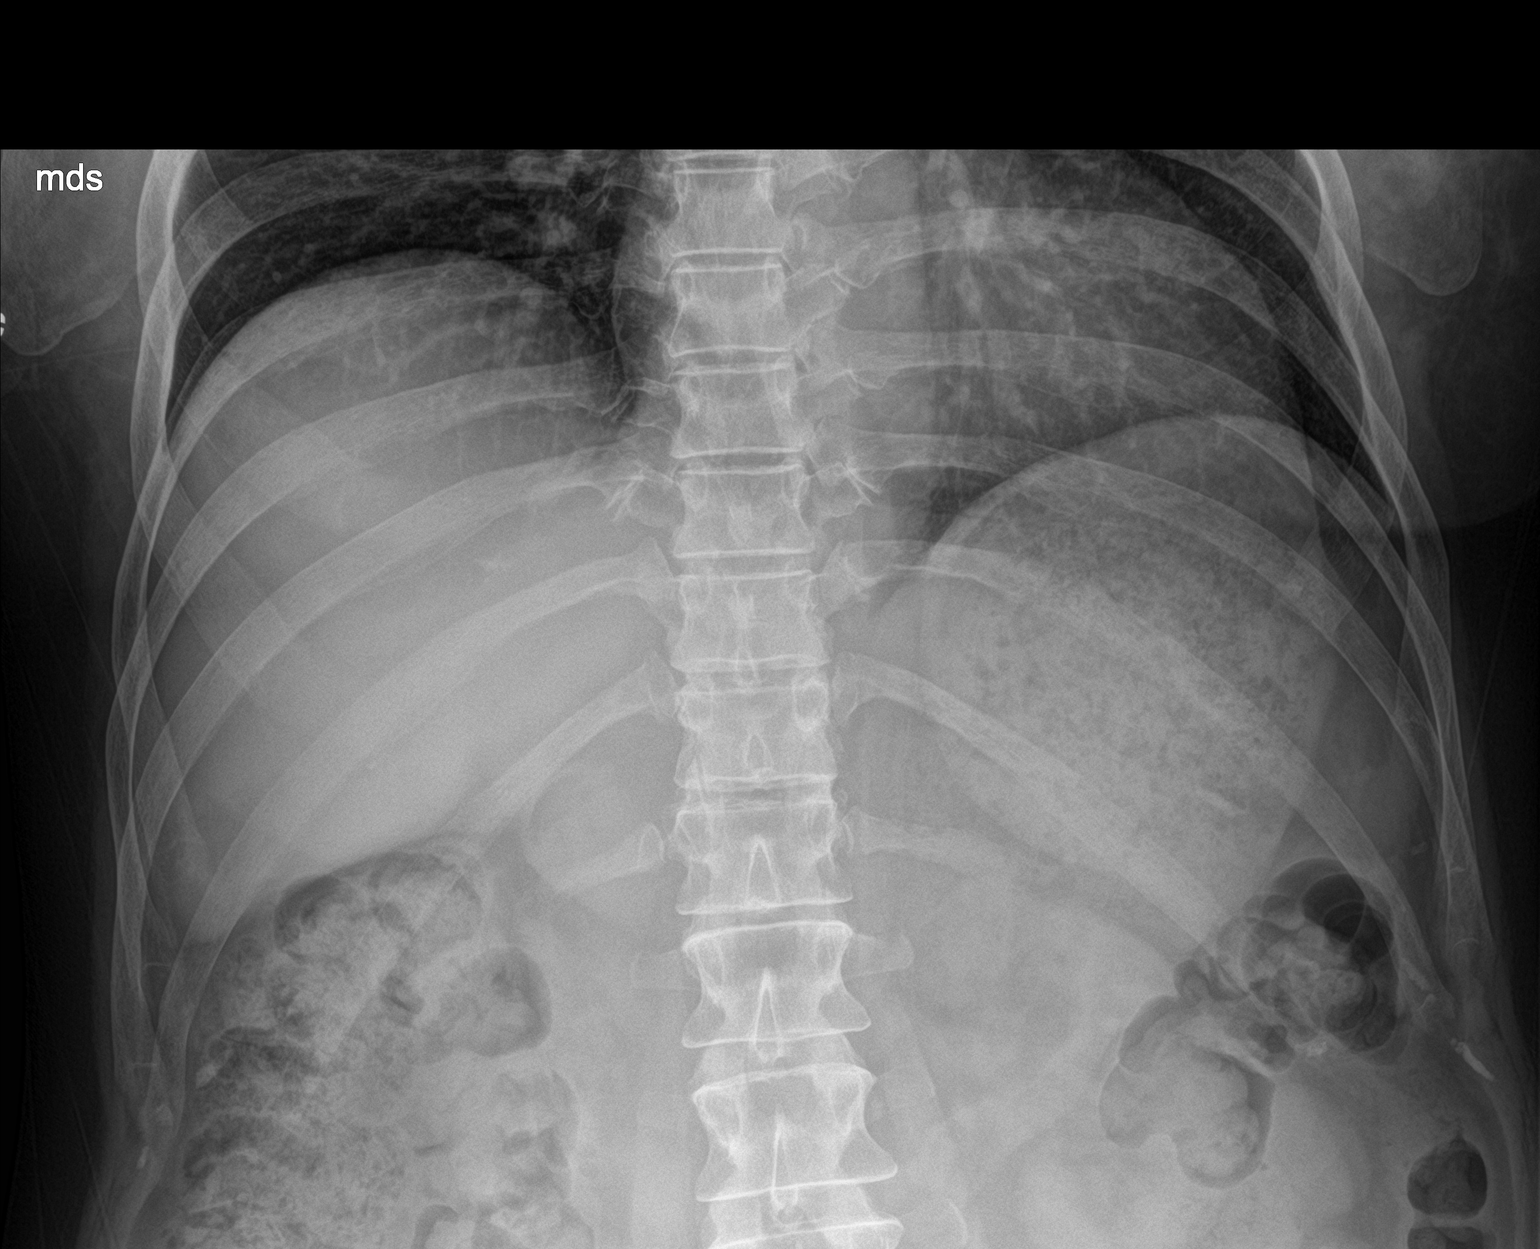
[im 2/2]
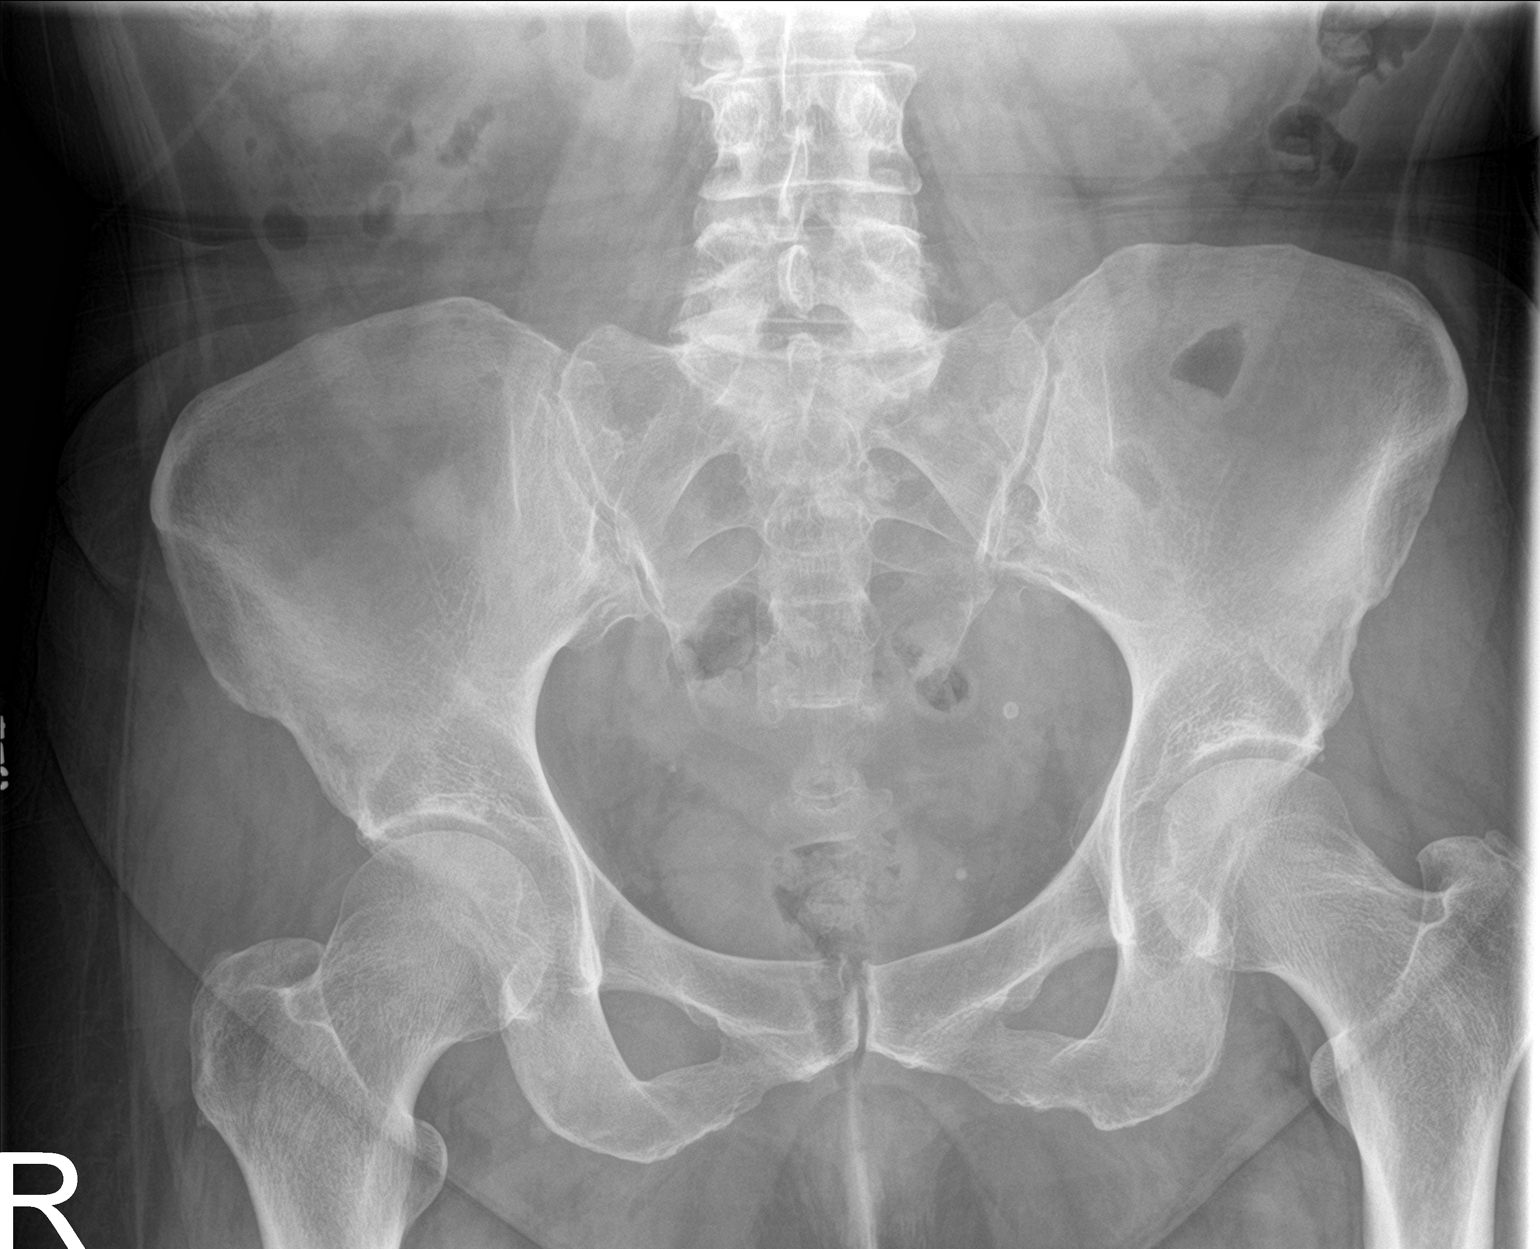

[2 of 2 positions shown; findings below may reference images not displayed]

FINDINGS: Interval removal of the left ureteral stent. The clustered
calcifications projecting over the left kidney on prior exam not
definitively seen. This area may not be included in the field of
view. No new calcifications over the renal beds, course of the
ureters or bladder. Pelvic phleboliths are again seen. Normal bowel
gas pattern.
IMPRESSION: Interval removal of the left ureteral stent. The clustered
calcifications projecting over the left kidney on prior exam are not
definitively seen.

## 2019-08-20 NOTE — Progress Notes (Signed)
08/20/2019 2:23 PM   Anne Haynes 1966/06/10 [id number]  Referring provider: No referring provider defined for this encounter.  Chief Complaint  Patient presents with  . Follow-up    HPI: 53 y.o. female presents for follow-up of nephrolithiasis.  She was found to have a 4 mm left distal ureteral calculus and 8 mm left UPJ calculus in mid October 2020.  She underwent ureteroscopic removal of her left distal ureteral calculus and placement of left ureteral stent.  The UPJ calculus was not treated due to the possibility of infection.  Urine culture was subsequently negative and she underwent shockwave lithotripsy which was successful.  Her stent was removed on 07/18/2019 and she is asymptomatic.  No previous history of stone disease.  She does have hypercalcemia.  At the time of stent removal there were minute fragments overlying the inferior renal outline.  Repeat KUB performed today was reviewed and no fragments were visualized however there is a moderate mount of stool and bowel gas overlying the left renal outline.   PMH: Past Medical History:  Diagnosis Date  . Kidney stone     Surgical History: Past Surgical History:  Procedure Laterality Date  . BACK SURGERY    . CYSTOSCOPY WITH STENT PLACEMENT Left 07/01/2019   Procedure: CYSTOSCOPY WITH STENT PLACEMENT;  Surgeon: Abbie Sons, MD;  Location: [facility];  Service: Urology;  Laterality: Left;  . EXTRACORPOREAL SHOCK WAVE LITHOTRIPSY Left 07/10/2019   Procedure: EXTRACORPOREAL SHOCK WAVE LITHOTRIPSY (ESWL);  Surgeon: Abbie Sons, MD;  Location: [facility];  Service: Urology;  Laterality: Left;  . STONE EXTRACTION WITH BASKET Left 07/01/2019   Procedure: STONE EXTRACTION WITH BASKET;  Surgeon: Abbie Sons, MD;  Location: [facility];  Service: Urology;  Laterality: Left;  . URETEROSCOPY WITH HOLMIUM LASER LITHOTRIPSY Left 07/01/2019   Procedure: URETEROSCOPY WITH HOLMIUM LASER LITHOTRIPSY;  Surgeon: Abbie Sons,  MD;  Location: [facility];  Service: Urology;  Laterality: Left;    Home Medications:  Allergies as of 08/20/2019   No Known Allergies     Medication List       Accurate as of August 20, 2019  2:23 PM. If you have any questions, ask your nurse or doctor.        STOP taking these medications   ondansetron 4 MG disintegrating tablet Commonly known as: Zofran ODT Stopped by: Abbie Sons, MD   oxybutynin 5 MG tablet Commonly known as: DITROPAN Stopped by: Abbie Sons, MD   oxyCODONE-acetaminophen 5-325 MG tablet Commonly known as: Percocet Stopped by: Abbie Sons, MD   tamsulosin 0.4 MG Caps capsule Commonly known as: FLOMAX Stopped by: Abbie Sons, MD     TAKE these medications   acetaminophen 650 MG CR tablet Commonly known as: TYLENOL Take 650 mg by mouth every 8 (eight) hours as needed for pain.   acyclovir cream 5 % Commonly known as: ZOVIRAX Apply topically.   ALPRAZolam 0.5 MG tablet Commonly known as: XANAX Take 0.5 mg by mouth 3 (three) times daily as needed.   dextroamphetamine 15 MG 24 hr capsule Commonly known as: DEXEDRINE SPANSULE Take 30 mg by mouth 2 (two) times daily.   ibuprofen 600 MG tablet Commonly known as: ADVIL Take 1 tablet (600 mg total) by mouth every 6 (six) hours as needed for moderate pain.   lamoTRIgine 100 MG tablet Commonly known as: LAMICTAL Take 1 at night (after finishing 25mg  size)   lamoTRIgine 25 MG tablet Commonly known as:  LAMICTAL Take 1 at night for 2 weeks, then 2 at night for 2 weeks (then increase to 100mg )   Vitamin D (Ergocalciferol) 1.25 MG (50000 UT) Caps capsule Commonly known as: DRISDOL Take by mouth.   ZOLMitriptan 2.5 MG tablet Commonly known as: ZOMIG Take by mouth.       Allergies: No Known Allergies  Family History: No family history on file.  Social History:  reports that she quit smoking about 19 years ago. She has quit using smokeless tobacco. She reports current  alcohol use. She reports that she does not use drugs.  ROS: UROLOGY Frequent Urination?: No Hard to postpone urination?: No Burning/pain with urination?: No Get up at night to urinate?: No Leakage of urine?: No Urine stream starts and stops?: No Trouble starting stream?: No Do you have to strain to urinate?: No Blood in urine?: No Urinary tract infection?: No Sexually transmitted disease?: No Injury to kidneys or bladder?: No Painful intercourse?: No Weak stream?: No Currently pregnant?: No Vaginal bleeding?: No Last menstrual period?: N/A  Gastrointestinal Nausea?: No Vomiting?: No Indigestion/heartburn?: No Diarrhea?: No Constipation?: No  Constitutional Fever: No Night sweats?: No Weight loss?: No Fatigue?: No  Skin Skin rash/lesions?: No Itching?: No  Eyes Blurred vision?: No Double vision?: No  Ears/Nose/Throat Sore throat?: No Sinus problems?: No  Hematologic/Lymphatic Swollen glands?: No Easy bruising?: No  Cardiovascular Leg swelling?: No Chest pain?: No  Respiratory Cough?: No Shortness of breath?: No  Endocrine Excessive thirst?: No  Musculoskeletal Back pain?: No Joint pain?: No  Neurological Headaches?: No Dizziness?: No  Psychologic Depression?: No Anxiety?: No  Physical Exam: BP (!) 144/81 (BP Location: Left Arm, Patient Position: Sitting, Cuff Size: Normal)   Pulse 86   Ht 5\' 8"  (1.727 m)   Wt 174 lb (78.9 kg)   BMI 26.46 kg/m   Constitutional:  Alert and oriented, No acute distress. HEENT: [place] AT, moist mucus membranes.  Trachea midline, no masses. Cardiovascular: No clubbing, cyanosis, or edema. Respiratory: Normal respiratory effort, no increased work of breathing.   Assessment & Plan:   Doing well status post ureteroscopic removal of a 4 mm distal calculus and shockwave lithotripsy of an 8 mm UPJ calculus.  She has had persistent hypercalcemia in reviewing prior records.  Intact PTH and repeat serum calcium  ordered today. If PTH is normal will proceed with a 24-hour urine study.   , MD  [facility] [address] [place], [address] [place] [phone]

## 2019-08-21 LAB — PTH, INTACT AND CALCIUM
Calcium: 12.1 mg/dL — ABNORMAL HIGH (ref 8.7–10.2)
PTH: 128 pg/mL — ABNORMAL HIGH (ref 15–65)

## 2019-08-22 ENCOUNTER — Encounter: Payer: Self-pay | Admitting: Urology

## 2019-08-25 ENCOUNTER — Other Ambulatory Visit: Payer: Self-pay | Admitting: Urology

## 2019-08-25 DIAGNOSIS — E210 Primary hyperparathyroidism: Secondary | ICD-10-CM

## 2019-09-02 ENCOUNTER — Other Ambulatory Visit: Payer: Self-pay

## 2019-09-02 ENCOUNTER — Telehealth: Payer: Self-pay | Admitting: Emergency Medicine

## 2019-09-02 ENCOUNTER — Ambulatory Visit (INDEPENDENT_AMBULATORY_CARE_PROVIDER_SITE_OTHER): Admitting: General Surgery

## 2019-09-02 ENCOUNTER — Encounter: Payer: Self-pay | Admitting: General Surgery

## 2019-09-02 ENCOUNTER — Ambulatory Visit (INDEPENDENT_AMBULATORY_CARE_PROVIDER_SITE_OTHER)

## 2019-09-02 ENCOUNTER — Other Ambulatory Visit
Admission: RE | Admit: 2019-09-02 | Discharge: 2019-09-02 | Disposition: A | Discharge status: Home / Self Care | Source: Ambulatory Visit | Attending: General Surgery | Admitting: General Surgery

## 2019-09-02 VITALS — BP 126/84 | HR 84 | Temp 97.7°F | Resp 12 | Ht 66.5 in | Wt 178.2 lb

## 2019-09-02 DIAGNOSIS — E213 Hyperparathyroidism, unspecified: Secondary | ICD-10-CM | POA: Insufficient documentation

## 2019-09-02 LAB — BASIC METABOLIC PANEL
Anion gap: 7 (ref 5–15)
BUN: 18 mg/dL (ref 6–20)
CO2: 27 mmol/L (ref 22–32)
Calcium: 11.4 mg/dL — ABNORMAL HIGH (ref 8.9–10.3)
Chloride: 105 mmol/L (ref 98–111)
Creatinine, Ser: 0.7 mg/dL (ref 0.44–1.00)
GFR calc Af Amer: >60 mL/min (ref >60)
GFR calc non Af Amer: >60 mL/min (ref >60)
Glucose, Bld: 87 mg/dL (ref 70–99)
Potassium: 3.7 mmol/L (ref 3.5–5.1)
Sodium: 139 mmol/L (ref 135–145)

## 2019-09-02 LAB — VITAMIN D 25 HYDROXY (VIT D DEFICIENCY, FRACTURES): Vit D, 25-Hydroxy: 21.95 ng/mL — ABNORMAL LOW (ref 30–100)

## 2019-09-02 LAB — CREATININE, URINE, RANDOM: Creatinine, Urine: 105 mg/dL

## 2019-09-02 NOTE — Patient Instructions (Addendum)
Please go to [facility] for lab work today.  Our Surgery Scheduler will call you within 24-48 hours to schedule your surgery, please have your [facility] Sheet available.   We will call you later today for your CT and Bone Density appointments.   Dr. Celine Ahr will call you with the CT and Bone Density results, no need for a follow up appointment.   CT Scan is scheduled for 09/09/19 at 11:15am at the [place]. Liquids only 4 hours prior to the appt.   Bone Density is scheduled for 09/16/19 at 11am at the [facility].

## 2019-09-02 NOTE — Progress Notes (Signed)
Patient ID: Anne Haynes, female   DOB: [birth year], 53 y.o.   MRN: [id number]  Chief Complaint  Patient presents with  . New Patient (Initial Visit)    Hyperparathyroidism     HPI Anne Haynes is a 53 y.o. female.  She has been referred by Dr. Lonna Cobb, of urology, for further evaluation of primary hyperparathyroidism.  She has been under his care for kidney stones.  He noticed that her serum calcium level had been elevated for many years and appropriately sent for intact parathyroid hormone, which was elevated.  He subsequently referred her to me for further evaluation.  Looking back through the electronic medical record, her calcium has been elevated since around 2016.  Her primary care provider recently initiated vitamin D supplementation in response to a low serum vitamin D level.  Other than this, however, no further investigation of her hypercalcemia has been performed, as far as I can see.  She does not take calcium supplementation, nor is she on thiazide diuretics.  She has no history of head or neck irradiation.  She was adopted at birth and therefore has no family medical history to reference.  Anne Haynes endorses constipation.  She states that she is often thirsty but does not drink as much as she thinks that she should.  She denies polyuria.  She she reports 1 or 2 episodes of nocturia on a regular basis.  She has never had pancreatitis.  She has not had bone density testing performed, but she has never had a pathologic fracture.  She denies difficulty falling asleep but endorses nonrestorative sleep and fatigue that seems out of proportion to her activity level.  She reports foggy thinking, memory changes, and some difficulty with word finding.  She also endorses mild depression.  She has elbow and hip pain, but does not specifically endorse long bone pain   Past Medical History:  Diagnosis Date  . Kidney stone     Past Surgical History:  Procedure Laterality Date  . BACK SURGERY     . CYSTOSCOPY WITH STENT PLACEMENT Left 07/01/2019   Procedure: CYSTOSCOPY WITH STENT PLACEMENT;  Surgeon: Riki Altes, MD;  Location: [facility];  Service: Urology;  Laterality: Left;  . EXTRACORPOREAL SHOCK WAVE LITHOTRIPSY Left 07/10/2019   Procedure: EXTRACORPOREAL SHOCK WAVE LITHOTRIPSY (ESWL);  Surgeon: Riki Altes, MD;  Location: [facility];  Service: Urology;  Laterality: Left;  . lower back surgery    . STONE EXTRACTION WITH BASKET Left 07/01/2019   Procedure: STONE EXTRACTION WITH BASKET;  Surgeon: Riki Altes, MD;  Location: [facility];  Service: Urology;  Laterality: Left;  . URETEROSCOPY WITH HOLMIUM LASER LITHOTRIPSY Left 07/01/2019   Procedure: URETEROSCOPY WITH HOLMIUM LASER LITHOTRIPSY;  Surgeon: Riki Altes, MD;  Location: [facility];  Service: Urology;  Laterality: Left;    History reviewed. No pertinent family history. Adopted  Social History Social History   Tobacco Use  . Smoking status: Former Smoker    Quit date: 07/09/2000    Years since quitting: 19.1  . Smokeless tobacco: Former Engineer, water Use Topics  . Alcohol use: Yes  . Drug use: Never    No Known Allergies  Current Outpatient Medications  Medication Sig Dispense Refill  . acetaminophen (TYLENOL) 650 MG CR tablet Take 650 mg by mouth every 8 (eight) hours as needed for pain.    Marland Kitchen acyclovir cream (ZOVIRAX) 5 % Apply topically.    . ALPRAZolam (XANAX) 0.5 MG tablet Take 0.5 mg by  mouth 3 (three) times daily as needed.    Marland Kitchen dextroamphetamine (DEXEDRINE SPANSULE) 15 MG 24 hr capsule Take 30 mg by mouth 2 (two) times daily.    Marland Kitchen ibuprofen (ADVIL) 600 MG tablet Take 1 tablet (600 mg total) by mouth every 6 (six) hours as needed for moderate pain. 30 tablet 0  . lamoTRIgine (LAMICTAL) 100 MG tablet Take 1 at night (after finishing 25mg  size)    . lamoTRIgine (LAMICTAL) 25 MG tablet Take 1 at night for 2 weeks, then 2 at night for 2 weeks (then increase to 100mg )    . Vitamin D,  Ergocalciferol, (DRISDOL) 1.25 MG (50000 UT) CAPS capsule Take by mouth.    Marland Kitchen ZOLMitriptan (ZOMIG) 2.5 MG tablet Take by mouth.     No current facility-administered medications for this visit.    Review of Systems Review of Systems  All other systems reviewed and are negative. Or as discussed in the history of present illness.  Blood pressure 126/84, pulse 84, temperature 97.7 F (36.5 C), resp. rate 12, height 5' 6.5" (1.689 m), weight 178 lb 3.2 oz (80.8 kg), SpO2 94 %. Body mass index is 28.33 kg/m.  Physical Exam Physical Exam Constitutional:      General: She is not in acute distress.    Appearance: Normal appearance. She is normal weight.  HENT:     Head: Normocephalic and atraumatic.     Nose:     Comments: Covered with a mask secondary to COVID-19 precautions    Mouth/Throat:     Comments: Covered with a mask secondary to COVID-19 precautions Eyes:     General: No scleral icterus.       Left eye: No discharge.     Conjunctiva/sclera: Conjunctivae normal.  Neck:     Comments: There is an approximately 1.5 cm nodule in the left lobe of the thyroid.  The gland moves freely with deglutition.  There is no palpable cervical or supraclavicular lymphadenopathy.  The trachea is midline. Cardiovascular:     Rate and Rhythm: Normal rate and regular rhythm.     Pulses: Normal pulses.  Pulmonary:     Effort: Pulmonary effort is normal.     Breath sounds: Normal breath sounds.  Abdominal:     General: Abdomen is flat. Bowel sounds are normal.     Palpations: Abdomen is soft.  Genitourinary:    Comments: Deferred Musculoskeletal:        General: No swelling. Normal range of motion.  Skin:    General: Skin is warm and dry.  Neurological:     General: No focal deficit present.     Mental Status: She is alert and oriented to person, place, and time.  Psychiatric:        Mood and Affect: Mood normal.        Behavior: Behavior normal.        Thought Content: Thought content  normal.     Data Reviewed I reviewed Dr. Dene Gentry notes, including his procedure notes dealing with the patient's nephrolithiasis.  He notes on August 20, 2019, that she has had persistent hypercalcemia and as a result he very appropriately ordered intact PTH and serum calcium. I reviewed labs from multiple health systems via the Care Everywhere function.  The first normal calcium level I was able to find dates back to 2016; since that time, her levels have been fairly consistently elevated.  I do not see that any investigation was performed.  I do see a low  vitamin D level from her recent primary care provider visit, which was appropriately responded to them by prescription of ergocalciferol.  Results for Anne Haynes, Anne Haynes (MRN [id number]) as of 09/03/2019 15:39  Ref. Range 06/30/2019 08:41 07/01/2019 09:40 07/02/2019 06:20 08/20/2019 14:45  COMPREHENSIVE METABOLIC PANEL Unknown Rpt (A)     Sodium Latest Ref Range: 135 - 145 mmol/L 138 141 142   Potassium Latest Ref Range: 3.5 - 5.1 mmol/L 3.7 4.1 4.0   Chloride Latest Ref Range: 98 - 111 mmol/L 106 109 108   CO2 Latest Ref Range: 22 - 32 mmol/L 28 23 24    Glucose Latest Ref Range: 70 - 99 mg/dL 409107 (H) 811121 (H) 914125 (H)   BUN Latest Ref Range: 6 - 20 mg/dL 16 13 9    Creatinine Latest Ref Range: 0.44 - 1.00 mg/dL 7.820.73 9.560.87 2.130.76   Calcium Latest Ref Range: 8.7 - 10.2 mg/dL 08.611.8 (H) 57.811.8 (H) 46.912.1 (H) 12.1 (H)  Anion gap Latest Ref Range: 5 - 15  4 (L) 9 10   Alkaline Phosphatase Latest Ref Range: 38 - 126 U/L 78     Albumin Latest Ref Range: 3.5 - 5.0 g/dL 4.5     Lipase Latest Ref Range: 11 - 51 U/L 22     AST Latest Ref Range: 15 - 41 U/L 18     ALT Latest Ref Range: 0 - 44 U/L 19     Total Protein Latest Ref Range: 6.5 - 8.1 g/dL 7.1     Total Bilirubin Latest Ref Range: 0.3 - 1.2 mg/dL 0.8     GFR, Est Non African American Latest Ref Range: >60 mL/min >60 >60 >60   GFR, Est African American Latest Ref Range: >60 mL/min >60 >60 >60   PTH,  Intact Latest Ref Range: 15 - 65 pg/mL    128 (H)  PTH Interp Unknown    Comment   These labs demonstrate normal renal function with elevated serum calcium and intact PTH.  Assessment This is a 53 year old woman with a history of nephrolithiasis.  She has elevated calcium and intact PTH, most consistent with a diagnosis of primary hyperparathyroidism.  Plan While primary hyperparathyroidism is most certainly the likely diagnosis, I will recheck vitamin D levels as well as spot urinary calcium to rule out other potential causes of elevated calcium.  We will also get a bone density study to determine whether or not she has any degree of osteopenia/osteoporosis prior to operative intervention.  I discussed the biology of primary hyperparathyroidism with the patient.  I discussed that the long-term sequelae of untreated disease can cause accelerated bone loss, hypertension, arterial disease, further problems with kidney stones, among other issues.  I also discussed that some of her constipation, fatigue, and foggy thinking, could also be attributed to her elevated calcium levels, but that these were somewhat less specific and may not completely resolve with surgical intervention.  She does meet criteria for surgery given the significant elevation in her calcium as well as history of nephrolithiasis, and I have recommended that she undergo parathyroidectomy.  The risks of parathyroid surgery were discussed with the patient, including (but not limited to): bleeding, infection, damage to surrounding structures/tissues, injury (temporary or permanent) to the recurrent laryngeal nerve, hypocalcemia (temporary or permanent), need to take calcium and/or vitamin D supplementation, recurrent hyperparathyroidism, failure to correct hyperparathyroidism, need for additional surgery.  The patient had the opportunity to ask any questions and these were answered to their satisfaction.  I performed a  bedside ultrasound in  clinic today that demonstrated multiple cysts scattered throughout the thyroid parenchyma.  There were 2 possible parathyroid candidates, 1 on each side.  Neither of these was particularly convincing.  I discussed with the patient that better localization would be beneficial, prior to proceeding with operative intervention.  Due to the multiple cysts, I did not think nuclear medicine parathyroid scan was the best option, as this can sometimes confound the findings.  We will schedule her for 4-dimensional CT scan to try and better localize her disease.  Once her labs and imaging studies are complete, we will look at scheduling her for an outpatient, likely minimally invasive, parathyroidectomy.    Duanne Guess 09/02/2019, 3:42 PM

## 2019-09-02 NOTE — Telephone Encounter (Signed)
CT Scan is scheduled for 09/09/19 at 11:15am at the [place] [facility]. Liquids only 4 hours prior to the appt.   Bone Density is scheduled for 09/16/19 at 11am at the [facility].  Patient informed of the above dates.

## 2019-09-03 LAB — PTH, INTACT AND CALCIUM
Calcium, Total (PTH): 11.8 mg/dL — ABNORMAL HIGH (ref 8.7–10.2)
PTH: 135 pg/mL — ABNORMAL HIGH (ref 15–65)

## 2019-09-03 LAB — CALCIUM, IONIZED: Calcium, Ionized, Serum: 6.6 mg/dL — ABNORMAL HIGH (ref 4.5–5.6)

## 2019-09-03 LAB — CALCIUM, URINE, RANDOM: Calcium, Ur: 29.9 mg/dL

## 2019-09-09 ENCOUNTER — Telehealth: Payer: Self-pay

## 2019-09-09 ENCOUNTER — Ambulatory Visit

## 2019-09-09 NOTE — Telephone Encounter (Signed)
Patient called office at this time to cancel her CT scan today at 11 am. Patient did not want to reschedule. I called scheduling to canell the appointment.

## 2019-09-16 ENCOUNTER — Other Ambulatory Visit

## 2019-09-18 ENCOUNTER — Encounter: Payer: Self-pay | Admitting: *Deleted

## 2019-10-14 ENCOUNTER — Telehealth: Payer: Self-pay | Admitting: *Deleted

## 2019-10-14 DIAGNOSIS — E213 Hyperparathyroidism, unspecified: Secondary | ICD-10-CM

## 2019-10-14 NOTE — Telephone Encounter (Signed)
Patient is scheduled for CT Parathyroid 4D on 10/24/19 at 8am at [facility]. Patient is to have no solid foods 4hr prior to scan. Patient is scheduled for Bone Density at 12:30p at [facility]. Patient was instructed if she currently takes Calcium supplements that she would need to stop those 24-48hrs prior to procedure and to bring a list of her current medications. Patient verbalized understanding.

## 2019-10-14 NOTE — Telephone Encounter (Signed)
Patient called and stated that she was suppose to have a CT parathyroid and Dexa scan back in December 2020 but had to cancel it. She now wants to get those scheduled but wants to know if she can do those at [facility] since its closer to home. Please call and advise

## 2019-10-17 ENCOUNTER — Other Ambulatory Visit (HOSPITAL_COMMUNITY)

## 2019-10-24 ENCOUNTER — Other Ambulatory Visit (HOSPITAL_COMMUNITY)

## 2019-10-24 ENCOUNTER — Ambulatory Visit (HOSPITAL_COMMUNITY)

## 2019-10-27 ENCOUNTER — Ambulatory Visit (HOSPITAL_COMMUNITY)
Admission: RE | Admit: 2019-10-27 | Discharge: 2019-10-27 | Disposition: A | Discharge status: Home / Self Care | Source: Ambulatory Visit | Attending: General Surgery | Admitting: General Surgery

## 2019-10-27 ENCOUNTER — Other Ambulatory Visit: Payer: Self-pay

## 2019-10-27 DIAGNOSIS — E213 Hyperparathyroidism, unspecified: Secondary | ICD-10-CM

## 2019-10-27 MED ORDER — IOHEXOL 300 MG/ML  SOLN
100.0000 mL | Freq: Once | INTRAMUSCULAR | Status: AC | PRN
  Administered 2019-10-27: 75 mL via INTRAVENOUS

## 2019-10-28 ENCOUNTER — Encounter: Payer: Self-pay | Admitting: General Surgery

## 2019-11-06 ENCOUNTER — Ambulatory Visit: Admitting: General Surgery

## 2019-11-13 ENCOUNTER — Encounter: Payer: Self-pay | Admitting: General Surgery

## 2019-11-13 ENCOUNTER — Ambulatory Visit (INDEPENDENT_AMBULATORY_CARE_PROVIDER_SITE_OTHER): Admitting: General Surgery

## 2019-11-13 ENCOUNTER — Other Ambulatory Visit: Payer: Self-pay

## 2019-11-13 ENCOUNTER — Other Ambulatory Visit: Payer: Self-pay | Admitting: Emergency Medicine

## 2019-11-13 VITALS — BP 154/97 | HR 79 | Temp 97.7°F | Resp 12 | Ht 66.0 in | Wt 185.0 lb

## 2019-11-13 DIAGNOSIS — M47812 Spondylosis without myelopathy or radiculopathy, cervical region: Secondary | ICD-10-CM

## 2019-11-13 DIAGNOSIS — E213 Hyperparathyroidism, unspecified: Secondary | ICD-10-CM

## 2019-11-13 NOTE — Patient Instructions (Addendum)
We will send in a referral to see a Neurosurgeon. They will contact you within 7-10 days to schedule an appointment with them. If you do not hear from them contact our office to see what's going on.   Our surgery scheduler will contact you to schedule your surgery. Please have the blue sheet available when she calls you.    Please call the office if you have any questions or concerns.   Parathyroidectomy  A parathyroidectomy is a surgery to remove one or more parathyroid glands. These glands are in the neck. Each gland is very small, about the size of a pea. Most people have four parathyroid glands. The glands produce parathyroid hormone, which helps to control the level of calcium in the body. You may have a parathyroidectomy if your body produces too much parathyroid hormone (hyperparathyroidism). This usually occurs when one or more of your parathyroid glands becomes enlarged from a type of noncancerous tumor (adenoma). Tell a health care provider about:  Any allergies you have.  All medicines you are taking, including vitamins, herbs, eye drops, creams, and over-the-counter medicines.  Any problems you or family members have had with anesthetic medicines.  Any blood disorders you have.  Any surgeries you have had.  Any medical conditions you have.  Whether you are pregnant or may be pregnant. What are the risks? Generally, this is a safe procedure. However, problems may occur, including:  Bleeding.  Infection.  Allergic reactions to medicines.  Damage to the nerves of your voice box (larynx). This can be temporary or long-term (rare).  Damage to nearby structures and organs, such as the skin (scarring), surrounding blood vessels, and nerves in the neck.  Hoarseness. This usually resolves in 24-48 hours.  A condition in which your body does not make enough parathyroid hormone (hypoparathyroidism). This is rare.  Difficulty breathing. This is rare. What happens before the  procedure? Staying hydrated Follow instructions from your health care provider about hydration, which may include:  Up to 2 hours before the procedure - you may continue to drink clear liquids, such as water, clear fruit juice, black coffee, and plain tea. Eating and drinking restrictions Follow instructions from your health care provider about eating and drinking, which may include:  8 hours before the procedure - stop eating heavy meals or foods such as meat, fried foods, or fatty foods.  6 hours before the procedure - stop eating light meals or foods, such as toast or cereal.  6 hours before the procedure - stop drinking milk or drinks that contain milk.  2 hours before the procedure - stop drinking clear liquids. Medicines Ask your health care provider about:  Changing or stopping your regular medicines. This is especially important if you are taking diabetes medicines or blood thinners.  Taking medicines such as aspirin and ibuprofen. These medicines can thin your blood. Do not take these medicines unless your health care provider tells you to take them.  Taking over-the-counter medicines, vitamins, herbs, and supplements. General instructions  You may be asked to shower with a germ-killing soap.  Plan to have someone take you home from the hospital or clinic.  Plan to have a responsible adult care for you for at least 24 hours after you leave the hospital or clinic. This is important. What happens during the procedure?  To lower your risk of infection: ? Your health care team will wash or sanitize their hands. ? Hair may be removed from the surgical area. ? Your skin  will be washed with soap.  An IV will be inserted into one of your veins.  You will be given one or more of the following: ? A medicine to help you relax (sedative). ? A medicine to make you fall asleep (general anesthetic).  An incision will be made according to the type of parathyroidectomy procedure  you are having. There are four methods that may be used: ? Open surgery. A single incision will be made in the center of your neck. The incision will be about 2-4 inches long. ? Minimally invasive surgery. A small incision will be made in the side of your neck. This incision will be about 1-2 inches long. Before the procedure, you might be given an injection of a type of medicine that will help the surgeon to locate the gland. ? Video-assisted surgery. Two small incisions will be made in your neck. One incision is for the instruments that will be used to remove the gland. The other incision is for a tiny camera that will help the surgeon to see inside your neck. ? Endoscopic surgery. An incision will be made just above your collarbone. A small, flexible tube (endoscope) will be inserted through this incision.  Your health care provider may monitor laryngeal nerve function during the procedure for safety reasons.  The gland or glands that are causing problems will be removed.  The incisions will be closed using stitches (sutures) or other methods. The sutures will often be hidden under the skin. The procedure may vary among health care providers and hospitals. What happens after the procedure?  Your blood pressure, heart rate, breathing rate, and blood oxygen level will be monitored until the medicines you were given have worn off.  You will be given pain medicine as needed.  Your provider will check your ability to talk and swallow after the procedure.  You will gradually start to drink liquids and have soft foods as tolerated.  Your blood will be tested to check the calcium level in your body.  Do not drive for 24 hours if you were given a sedative during your procedure. Summary  The parathyroid glands are located in the neck and produce parathyroid hormone, which helps to control the level of calcium in the body.  A parathyroidectomy is a surgery to remove one or more parathyroid  glands.  You may have a parathyroidectomy if your body produces too much parathyroid hormone (hyperparathyroidism).  There are four surgical methods that may be used for a parathyroidectomy: open, minimally invasive, video-assisted, and endoscopic.  Generally, this is a safe procedure. However, problems may occur, including bleeding, infection, and a hoarse or weak voice. This information is not intended to replace advice given to you by your health care provider. Make sure you discuss any questions you have with your health care provider. Document Revised: 08/17/2017 Document Reviewed: 07/10/2017 Elsevier Patient Education  2020 [employer].

## 2019-11-13 NOTE — Progress Notes (Signed)
Anne Haynes is here today to discuss results of her recent studies and to discuss plans going forward for possible surgical intervention.  She was referred to me by her urologist who was treating her for nephrolithiasis.  Biochemical evaluation was consistent with primary hyperparathyroidism.  I was unable to appreciate a parathyroid adenoma on bedside clinical ultrasound.  I therefore obtained a 4D CT scan which showed a large left-sided parathyroid adenoma.  She also had a bone density study that demonstrated osteopenia, most prevalent in her nondominant forearm.  The results of all of the studies are copied below.  I personally reviewed all of these images and concur with the radiology interpretations.  EXAM: DUAL X-RAY ABSORPTIOMETRY (DXA) FOR BONE MINERAL DENSITY  IMPRESSION: Your patient [name] completed a BMD test on 10/27/2019 using the [employer] DXA System (software version: 14.10) manufactured by [employer]. The following summarizes the results of our evaluation. Technologist::TNB  PATIENT BIOGRAPHICAL: Name: Anne, Haynes Patient ID: [id number] Birth Date: 11/03/1965 Height: 66.5 in. Gender: Female Exam Date: 10/27/2019 Weight: 178.2 lbs. Indications: Caucasian, Height Loss, Hyperparathyroid, Low Calcium Intake, Post Menopausal Fractures: Treatments: Vitamin D  DENSITOMETRY RESULTS: Site         Region     Measured Date Measured Age WHO Classification Young Adult T-score BMD         %Change vs. Previous Significant Change (*)  AP Spine L1-L4 10/27/2019 56.0 Normal 0.3 1.211 g/cm2  DualFemur Neck Left 10/27/2019 56.0 Normal -0.8 0.929 g/cm2  Left Forearm Radius 33% 10/27/2019 56.0 Osteopenia -1.2 0.628 g/cm2  ASSESSMENT: The BMD measured at Forearm Radius 33% is 0.628 g/cm2 with a T-score of -1.2.  This patient is considered OSTEOPENIC according to World Health Organization [facility]) criteria.  The scan quality is good.  World Environmental consultant [employer] [facility]) criteria for post-menopausal, Caucasian Women: Normal:       T-score at or above -1 SD Osteopenia:   T-score between -1 and -2.5 SD Osteoporosis: T-score at or below -2.5 SD  RECOMMENDATIONS: 1. All patients should optimize calcium and vitamin D intake. 2. Consider FDA-approved medical therapies in postmenopausal women and med aged 81 years and older, based on the following: a. A hip or vertebral (clinical or morphometric) fracture b. T-scoren< -2.5 at the femoral neck or spine after appropriate evaluation to exclude secondary causes c. Low bone mass (T-score between -1.0 and -2.5 at the femoral neck or spine) and a 10-year probability of a hip fracture > 3% or a 10-year probability of a major osteoporosis-related fracture > 20% based on the [place]-adapted WHO algorithm d. Clinician judgment and/or patient preferences may indicate treatment for people with 10-year fracture probabilities above or below these levels  CLINICAL DATA:  Hyperparathyroidism.  EXAM: CT NECK WITH AND WITHOUT CONTRAST  TECHNIQUE: Multidetector CT imaging of the neck was performed without and with intravenous contrast.  CONTRAST:  57mL OMNIPAQUE IOHEXOL 300 MG/ML  SOLN  COMPARISON:  None.  FINDINGS: Pharynx and larynx: No evidence of mass or swelling. Mild asymmetry at the level of the right-sided larynx likely related to asymmetric right-sided cervical spurring.  Salivary glands: No visible inflammation, mass, or stone.  Thyroid/parathyroid: There is a ovoid mass posterior/extracapsular to the left thyroid lobe which measures 27 mm craniocaudal and 17 mm in transverse span. The mass is hypervascular on the arterial phase and washes out on the delayed phase. There is likely a polar vessel, although this could be a closely neighboring normal inferior thyroidal branch. The other  parathyroid glands are not visible/hyperplastic appearing  The thyroid itself shows multiple  bilateral nodules. There is evidence of thyroid ultrasound performed September 02, 2019 at outside facility. The dominant nodules on the left at 13 mm.  Lymph nodes: None enlarged or abnormal density.  Vascular: Negative  Limited intracranial: Not covered  Visualized orbits: Not covered  Mastoids and visualized paranasal sinuses: Not covered  Skeleton: Severe facet osteoarthritis on the right with remarkably bulky spurring and C5-6 anterolisthesis.  Upper chest: Negative  IMPRESSION: 1. Positive for large parathyroid adenoma on the left. 2. Multinodular thyroid with dominant nodule on the left measuring up to 13 mm.  I also obtained additional labs, confirming the diagnosis.  These are copied below:  Results for CHRISTIE, VISCOMI (MRN [id number]) as of 11/13/2019 16:40  Ref. Range 08/20/2019 14:45 09/02/2019 15:04 09/02/2019 16:32  Sodium Latest Ref Range: 135 - 145 mmol/L   139  Potassium Latest Ref Range: 3.5 - 5.1 mmol/L   3.7  Chloride Latest Ref Range: 98 - 111 mmol/L   105  CO2 Latest Ref Range: 22 - 32 mmol/L   27  Glucose Latest Ref Range: 70 - 99 mg/dL   87  BUN Latest Ref Range: 6 - 20 mg/dL   18  Creatinine Latest Ref Range: 0.44 - 1.00 mg/dL   0.70  Calcium Latest Ref Range: 8.9 - 10.3 mg/dL 12.1 (H)  11.4 (H)  Anion gap Latest Ref Range: 5 - 15    7  Calcium, Ionized, Serum Latest Ref Range: 4.5 - 5.6 mg/dL   6.6 (H)  GFR, Est Non African American Latest Ref Range: >60 mL/min   >60  GFR, Est African American Latest Ref Range: >60 mL/min   >60  Vitamin D, 25-Hydroxy Latest Ref Range: 30 - 100 ng/mL   21.95 (L)  PTH, Intact Latest Ref Range: 15 - 65 pg/mL 128 (H)  135 (H)  Calcium, Total (PTH) Latest Ref Range: 8.7 - 10.2 mg/dL   11.8 (H)  PTH Interp Unknown Comment  Comment  Calcium, Ur Latest Ref Range: Not Estab. mg/dL   29.9  Creatinine, Urine Latest Units: mg/dL   105   Specifically, the spot urine calcium rules out the possibility of benign familial  hypocalciuric hypercalcemia.  The degree of vitamin D deficiency is not enough to explain the level of intact PTH and her calcium is markedly elevated supporting the diagnosis.  Today I reviewed all of these findings with the patient.  I explained my rationale for recommending surgery, including avoidance of future episodes of nephrolithiasis, or at least minimizing the frequency; reducing the rate of progression of osteopenia/osteoporosis and subsequent pathologic fracture; the risks associated with increased cardiac calcification, hypertension, and other cardiovascular side effects; potential improvement in sleep quality; relief of constipation; improvement in memory mood.  I also gave her the caveat that many of the symptoms can also be caused by other etiologies but that surgery is recommended as a definitive treatment for hypercalcemia secondary to hyperparathyroidism.  She indicated a desire to proceed, but states that she will have to make some arrangements at work in order to facilitate the requisite time off.  We will have our surgery scheduler contact her and find a mutually convenient date for [place] to perform her operation.  In addition, due to her chronic pain related to cervical spine issues, I have placed a referral to neurosurgery at [facility] for evaluation and management.

## 2019-11-14 ENCOUNTER — Telehealth: Payer: Self-pay | Admitting: Emergency Medicine

## 2019-11-14 NOTE — Telephone Encounter (Signed)
Referral including demo sheet, insurance card and last office visit note sent to Dr. Laurene Footman (Neurosurgery at [facility]) [phone] at this time.

## 2019-11-17 ENCOUNTER — Telehealth: Payer: Self-pay | Admitting: General Surgery

## 2019-11-17 NOTE — Telephone Encounter (Signed)
Outbound call made & message left requesting a call back to discuss the following appointment dates/times:  Surgery Date: 11/28/19 Preadmission Testing Date: 11/19/19 (8a-1p) Covid Testing Date: 11/26/19 - [facility] [facility])  Patient also needs to be made aware to call [phone], between 1-3:00pm the day before surgery, to find out what time to arrive.

## 2019-11-19 ENCOUNTER — Other Ambulatory Visit: Payer: Self-pay | Admitting: General Surgery

## 2019-11-19 ENCOUNTER — Inpatient Hospital Stay
Admission: RE | Admit: 2019-11-19 | Discharge: 2019-11-19 | Disposition: A | Discharge status: Home / Self Care | Source: Ambulatory Visit

## 2019-11-19 ENCOUNTER — Telehealth: Payer: Self-pay | Admitting: General Surgery

## 2019-11-19 DIAGNOSIS — E213 Hyperparathyroidism, unspecified: Secondary | ICD-10-CM

## 2019-11-19 NOTE — Telephone Encounter (Signed)
Inbound call received from [facility] in [facility] Pre-Admission Dept advising the pt is unable to undergo surgery @ this time, as she is unable to take 2 weeks off of work.  The pt indicated she will have to wait a few months down the road before she is able to schedule. Pre-Admission will cxl the COVID testing scheduled & OR scheduling has been notified to cxl the case for now.  Thank you

## 2019-11-19 NOTE — Pre-Procedure Instructions (Signed)
ECG 12 Lead12/02/2015 [facility] Component Name Value Ref Range  EKG Systolic BP  mmHg  EKG Diastolic BP  mmHg  EKG Ventricular Rate 69 BPM  EKG Atrial Rate 69 BPM  EKG P-R Interval 124 ms  EKG QRS Duration 82 ms  EKG Q-T Interval 388 ms  EKG QTC Calculation 415 ms  EKG Calculated P Axis 60 degrees  EKG Calculated R Axis 17 degrees  EKG Calculated T Axis 35 degrees  Result Narrative  Normal sinus rhythm Normal ECG No previous ECGs available Confirmed by Clydie Braun [phone]) on 08/24/2015 9:55:19 AM  Other Result Information  Interface, Rad Results In - 08/24/2015  9:55 AM EST Normal sinus rhythm Normal ECG No previous ECGs available Confirmed by Clydie Braun [phone]) on 08/24/2015 9:55:19 AM  Status Results Details   Unavailable

## 2019-11-26 ENCOUNTER — Other Ambulatory Visit

## 2019-11-28 ENCOUNTER — Ambulatory Visit: Admit: 2019-11-28 | Admitting: General Surgery

## 2019-11-28 SURGERY — PARATHYROIDECTOMY
Anesthesia: General

## 2019-12-16 ENCOUNTER — Ambulatory Visit: Attending: Internal Medicine

## 2019-12-16 DIAGNOSIS — Z23 Encounter for immunization: Secondary | ICD-10-CM

## 2019-12-16 NOTE — Progress Notes (Signed)
   Covid-19 Vaccination Clinic  Name:  Anne Haynes    MRN: [id number] DOB: [birth year]  12/16/2019  [name] was observed post Covid-19 immunization for 15 minutes without incident. She was provided with Vaccine Information Sheet and instruction to access the V-Safe system.   [name] was instructed to call 911 with any severe reactions post vaccine: Marland Kitchen Difficulty breathing  . Swelling of face and throat  . A fast heartbeat  . A bad rash all over body  . Dizziness and weakness   Immunizations Administered    Name Date Dose VIS Date Route   Moderna COVID-19 Vaccine 12/16/2019  1:22 PM 0.5 mL 08/19/2019 Intramuscular   Manufacturer: Moderna   Lot: [id number]   NDC: 85027-741-28

## 2021-06-27 ENCOUNTER — Encounter: Payer: Self-pay | Admitting: General Surgery

## 2024-02-27 ENCOUNTER — Ambulatory Visit
Admission: RE | Admit: 2024-02-27 | Discharge: 2024-02-27 | Disposition: A | Discharge status: Home / Self Care | Source: Ambulatory Visit | Attending: Physician Assistant | Admitting: Physician Assistant

## 2024-02-27 VITALS — BP 130/81 | HR 90 | Temp 98.7°F | Resp 18

## 2024-02-27 DIAGNOSIS — R0981 Nasal congestion: Secondary | ICD-10-CM

## 2024-02-27 DIAGNOSIS — J209 Acute bronchitis, unspecified: Secondary | ICD-10-CM

## 2024-02-27 DIAGNOSIS — R051 Acute cough: Secondary | ICD-10-CM | POA: Diagnosis not present

## 2024-02-27 MED ORDER — PREDNISONE 20 MG PO TABS: 40.0000 mg | ORAL_TABLET | Freq: Every day | ORAL | 0 refills | Status: AC

## 2024-02-27 MED ORDER — PROMETHAZINE-DM 6.25-15 MG/5ML PO SYRP: 5.0000 mL | ORAL_SOLUTION | Freq: Four times a day (QID) | ORAL | 0 refills | Status: AC | PRN

## 2024-02-27 NOTE — ED Triage Notes (Signed)
 Headache, sinus pressure, cough, congestion x 5 days. Taking ibuprofen  and tylenol .

## 2024-02-27 NOTE — ED Provider Notes (Signed)
 [facility]    CSN: [id number] Arrival date & time: 02/27/24  1321      History   Chief Complaint Chief Complaint  Patient presents with   Cough   Nasal Congestion    HPI Anne Haynes is a 58 y.o. female presenting for 5 day history of nasal congestion, cough, headaches, and sinus pressure. No fever, sore throat, chest pain, shortness of breath. Unsure of any sick contacts. Taking OTC meds without relief. No history of asthma, COPD. Former smoker.  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    Past Surgical History:  Procedure Laterality Date   BACK SURGERY     CYSTOSCOPY WITH STENT PLACEMENT Left 07/01/2019   Procedure: CYSTOSCOPY WITH STENT PLACEMENT;  Surgeon: Geraline Knapp, MD;  Location: [facility] ORS;  Service: Urology;  Laterality: Left;   EXTRACORPOREAL SHOCK WAVE LITHOTRIPSY Left 07/10/2019   Procedure: EXTRACORPOREAL SHOCK WAVE LITHOTRIPSY (ESWL);  Surgeon: Geraline Knapp, MD;  Location: [facility] ORS;  Service: Urology;  Laterality: Left;   lower back surgery     STONE EXTRACTION WITH BASKET Left 07/01/2019   Procedure: STONE EXTRACTION WITH BASKET;  Surgeon: Geraline Knapp, MD;  Location: [facility] ORS;  Service: Urology;  Laterality: Left;   URETEROSCOPY WITH HOLMIUM LASER LITHOTRIPSY Left 07/01/2019   Procedure: URETEROSCOPY WITH HOLMIUM LASER LITHOTRIPSY;  Surgeon: Geraline Knapp, MD;  Location: [facility] ORS;  Service: Urology;   Laterality: Left;    OB History   No obstetric history on file.      Home Medications    Prior to Admission medications   Medication Sig Start Date End Date Taking? Authorizing Provider  predniSONE (DELTASONE) 20 MG tablet Take 2 tablets (40 mg total) by mouth daily for 5 days. 02/27/24 03/03/24 Yes Floydene Hy, PA-C  promethazine-dextromethorphan (PROMETHAZINE-DM) 6.25-15 MG/5ML syrup Take 5 mLs by mouth 4 (four) times daily as needed. 02/27/24  Yes Floydene Hy, PA-C  acetaminophen  (TYLENOL ) 650 MG CR tablet Take 650 mg by mouth every 8 (eight) hours as needed for pain.   Yes [provider]  acyclovir cream (ZOVIRAX) 5 % Apply topically.    [provider]  ALPRAZolam  (XANAX ) 0.5 MG tablet Take 0.5 mg by mouth 3 (three) times daily as needed. 03/14/19   [provider]  cyclobenzaprine (FLEXERIL) 5 MG tablet Take 5 mg by mouth 3 (three) times daily as needed. 11/09/19   [provider]  dextroamphetamine  (DEXEDRINE  SPANSULE) 15 MG 24 hr capsule Take 30 mg by mouth 2 (two) times daily. 06/17/19  Yes [provider]  ibuprofen  (ADVIL ) 600 MG tablet Take 1 tablet (600 mg total) by mouth every 6 (six) hours as needed for moderate pain. 07/02/19  Yes Brenna Cam, MD  lamoTRIgine (LAMICTAL) 100 MG tablet Take 1 at night (after finishing 25mg  size) 07/25/19   [provider]  lamoTRIgine (LAMICTAL) 25 MG tablet Take 1 at night for 2 weeks, then 2 at night for 2 weeks (then  increase to 100mg ) 07/25/19   [provider]  ZOLMitriptan (ZOMIG) 2.5 MG tablet Take by mouth.    [provider]    Family History History reviewed. No pertinent family history.  Social History Social History   Tobacco Use   Smoking status: Former    Current packs/day: 0.00    Types: Cigarettes    Quit date: 07/09/2000    Years since quitting: 23.6   Smokeless tobacco: Former  Building services engineer status: Never Used  Substance Use Topics    Alcohol use: Yes   Drug use: Never     Allergies   Patient has no known allergies.   Review of Systems Review of Systems  Constitutional:  Positive for fatigue. Negative for chills, diaphoresis and fever.  HENT:  Positive for congestion, rhinorrhea and sinus pressure. Negative for ear pain, sinus pain and sore throat.   Respiratory:  Positive for cough. Negative for shortness of breath.   Cardiovascular:  Negative for chest pain.  Gastrointestinal:  Negative for abdominal pain, nausea and vomiting.  Musculoskeletal:  Negative for arthralgias and myalgias.  Skin:  Negative for rash.  Neurological:  Positive for headaches. Negative for weakness.  Hematological:  Negative for adenopathy.     Physical Exam Triage Vital Signs ED Triage Vitals  Encounter Vitals Group     BP      Systolic BP Percentile      Diastolic BP Percentile      Pulse      Resp      Temp      Temp src      SpO2      Weight      Height      Head Circumference      Peak Flow      Pain Score      Pain Loc      Pain Education      Exclude from Growth Chart    No data found.  Updated Vital Signs BP 130/81 (BP Location: Left Arm)   Pulse 90   Temp 98.7 F (37.1 C) (Oral)   Resp 18   SpO2 96%    Physical Exam Vitals and nursing note reviewed.  Constitutional:      General: She is not in acute distress.    Appearance: Normal appearance. She is not ill-appearing or toxic-appearing.  HENT:     Head: Normocephalic and atraumatic.     Nose: Congestion present.     Mouth/Throat:     Mouth: Mucous membranes are moist.     Pharynx: Oropharynx is clear.  Eyes:     General: No scleral icterus.       Right eye: No discharge.        Left eye: No discharge.     Conjunctiva/sclera: Conjunctivae normal.  Cardiovascular:     Rate and Rhythm: Normal rate and regular rhythm.     Heart sounds: Normal heart sounds.  Pulmonary:     Effort: Pulmonary effort is normal. No respiratory distress.      Breath sounds: Rhonchi (bilateral upper lung fields) present.  Musculoskeletal:     Cervical back: Neck supple.  Skin:    General: Skin is dry.  Neurological:     General: No focal deficit present.     Mental Status: She is alert. Mental status is at baseline.     Motor: No weakness.     Gait: Gait normal.  Psychiatric:  Mood and Affect: Mood normal.        Behavior: Behavior normal.      UC Treatments / Results  Labs (all labs ordered are listed, but only abnormal results are displayed) Labs Reviewed - No data to display  EKG   Radiology No results found.  Procedures Procedures (including critical care time)  Medications Ordered in UC Medications - No data to display  Initial Impression / Assessment and Plan / UC Course  I have reviewed the triage vital signs and the nursing notes.  Pertinent labs & imaging results that were available during my care of the patient were reviewed by me and considered in my medical decision making (see chart for details).   58 y/o female presents for 5 day history of nasal congestion, cough, sinus pressure and headaches. No fever, sore throat.  Vitals stable and patient overall well appearing. NAD. On exam, has nasal congestion. Throat clear. Scattered rhonchi bilateral upper lung fields.  Acute bronchitis. Treating at this time with prednisone, promethazine DM. Encouraged rest and fluids. Reviewed typical course of illness and dicussed return and ED precautions.   Final Clinical Impressions(s) / UC Diagnoses   Final diagnoses:  Acute bronchitis, unspecified organism  Acute cough  Nasal congestion     Discharge Instructions      - You have a chest cold which last longer than a regular cold.  You can have a cough for couple of weeks. - Increase rest and fluids.  I sent cough medicine and prednisone to the pharmacy. - If you develop a fever or feel short of breath or you are not feeling better in a couple weeks please  return for reevaluation.   ED Prescriptions     Medication Sig Dispense Auth. Provider   promethazine-dextromethorphan (PROMETHAZINE-DM) 6.25-15 MG/5ML syrup Take 5 mLs by mouth 4 (four) times daily as needed. 118 mL Nancy Axon B, PA-C   predniSONE (DELTASONE) 20 MG tablet Take 2 tablets (40 mg total) by mouth daily for 5 days. 10 tablet Keshav Winegar B, PA-C      PDMP not reviewed this encounter.   Floydene Hy, PA-C 02/27/24 1411

## 2024-02-27 NOTE — Discharge Instructions (Addendum)
-   You have a chest cold which last longer than a regular cold.  You can have a cough for couple of weeks. - Increase rest and fluids.  I sent cough medicine and prednisone to the pharmacy. - If you develop a fever or feel short of breath or you are not feeling better in a couple weeks please return for reevaluation.
